# Patient Record
Sex: Male | Born: 1995 | Hispanic: No | Marital: Single | Smoking: Never smoker
Health system: Southern US, Community
[De-identification: ages and names within clinical notes are randomized; demographics above are authoritative.]

## PROBLEM LIST (undated history)

## (undated) DIAGNOSIS — J45909 Unspecified asthma, uncomplicated: Secondary | ICD-10-CM

## (undated) HISTORY — PX: TYMPANOSTOMY TUBE PLACEMENT: SHX32

## (undated) HISTORY — PX: WISDOM TOOTH EXTRACTION: SHX21

---
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
## 2021-06-13 ENCOUNTER — Ambulatory Visit: Payer: Self-pay

## 2021-06-13 ENCOUNTER — Ambulatory Visit
Admission: EM | Admit: 2021-06-13 | Discharge: 2021-06-13 | Disposition: A | Discharge status: Home / Self Care | Payer: Self-pay | Attending: Family Medicine | Admitting: Family Medicine

## 2021-06-13 ENCOUNTER — Ambulatory Visit (INDEPENDENT_AMBULATORY_CARE_PROVIDER_SITE_OTHER): Payer: Self-pay

## 2021-06-13 DIAGNOSIS — M25511 Pain in right shoulder: Secondary | ICD-10-CM

## 2021-06-13 DIAGNOSIS — S40021A Contusion of right upper arm, initial encounter: Secondary | ICD-10-CM

## 2021-06-13 DIAGNOSIS — S60221A Contusion of right hand, initial encounter: Secondary | ICD-10-CM

## 2021-06-13 NOTE — ED Provider Notes (Signed)
?[facility] ? ? ? ?CSN: [id number] ?Arrival date & time: 06/13/21  1543 ? ? ?  ? ?History   ?Chief Complaint ?Chief Complaint  ?Patient presents with  ? Arm Swelling  ?  Arm swelling and pain  ? ? ?HPI ?Javier Mcgee is a 26 y.o. male.  ? ?Patient presenting today with significant bruising, swelling, pain to the right upper arm after a semitruck door slammed onto it.  Also some bruising and swelling to the right hand but much less pain or concern for this area.  Denies loss of range of motion, numbness, tingling, shooting pains down the arm.  Has been using ice to the area with mild temporary relief. ? ? ?Past Medical History:  ?Diagnosis Date  ? Asthma   ? ? ?There are no problems to display for this patient. ? ? ?Past Surgical History:  ?Procedure Laterality Date  ? TYMPANOSTOMY TUBE PLACEMENT    ? ? ? ? ? ?Home Medications   ? ?Prior to Admission medications   ?Not on File  ? ? ?Family History ?Family History  ?Problem Relation Age of Onset  ? Healthy Mother   ? Healthy Father   ? ? ?Social History ?Social History  ? ?Tobacco Use  ? Smoking status: Never  ?  Passive exposure: Never  ? Smokeless tobacco: Never  ?Vaping Use  ? Vaping Use: Never used  ?Substance Use Topics  ? Alcohol use: Yes  ?  Comment: Occas  ? Drug use: No  ? ? ? ?Allergies   ?Patient has no known allergies. ? ? ?Review of Systems ?Review of Systems ?Per HPI ? ?Physical Exam ?Triage Vital Signs ?ED Triage Vitals  ?Enc Vitals Group  ?   BP 06/13/21 1551 (!) 154/112  ?   Pulse Rate 06/13/21 1551 (!) 129  ?   Resp 06/13/21 1551 18  ?   Temp 06/13/21 1551 99.2 ?F (37.3 ?C)  ?   Temp Source 06/13/21 1551 Oral  ?   SpO2 06/13/21 1551 96 %  ?   Weight --   ?   Height --   ?   Head Circumference --   ?   Peak Flow --   ?   Pain Score 06/13/21 1554 7  ?   Pain Loc --   ?   Pain Edu? --   ?   Excl. in GC? --   ? ?No data found. ? ?Updated Vital Signs ?BP (!) 154/112 (BP Location: Right Arm)   Pulse (!) 129   Temp 99.2 ?F (37.3 ?C) (Oral)    Resp 18   SpO2 96%  ? ?Visual Acuity ?Right Eye Distance:   ?Left Eye Distance:   ?Bilateral Distance:   ? ?Right Eye Near:   ?Left Eye Near:    ?Bilateral Near:    ? ?Physical Exam ?Vitals and nursing note reviewed.  ?Constitutional:   ?   Appearance: Normal appearance.  ?HENT:  ?   Head: Atraumatic.  ?Eyes:  ?   Extraocular Movements: Extraocular movements intact.  ?   Conjunctiva/sclera: Conjunctivae normal.  ?Cardiovascular:  ?   Rate and Rhythm: Normal rate and regular rhythm.  ?Pulmonary:  ?   Effort: Pulmonary effort is normal.  ?   Breath sounds: Normal breath sounds.  ?Musculoskeletal:     ?   General: Swelling, tenderness and signs of injury present. No deformity. Normal range of motion.  ?   Cervical back: Normal range of motion and neck  supple.  ?   Comments: Range of motion painful but intact.  Grip strength full and equal bilateral hands.  Significant edema and bruising to the mid upper arm on the right, tender to palpation diffusely  ?Skin: ?   General: Skin is warm and dry.  ?   Findings: Bruising present.  ?Neurological:  ?   General: No focal deficit present.  ?   Mental Status: He is oriented to person, place, and time.  ?   Comments: Right upper extremity neurovascularly intact  ?Psychiatric:     ?   Mood and Affect: Mood normal.     ?   Thought Content: Thought content normal.     ?   Judgment: Judgment normal.  ? ? ? ?UC Treatments / Results  ?Labs ?(all labs ordered are listed, but only abnormal results are displayed) ?Labs Reviewed - No data to display ? ?EKG ? ? ?Radiology ?DG Shoulder Right ? ?Result Date: 06/13/2021 ?CLINICAL DATA:  Right shoulder pain.  Injured today at work. EXAM: RIGHT SHOULDER - 2+ VIEW COMPARISON:  None. FINDINGS: The glenohumeral and AC joints are maintained. No acute fractures identified. No abnormal soft tissue calcifications. Moderate lateral downsloping of the acromion is noted. The right ribs are intact and the visualized right lung is clear. IMPRESSION: No  acute bony findings. Electronically Signed   By: Rudie Meyer M.D.   On: 06/13/2021 16:12   ? ?Procedures ?Procedures (including critical care time) ? ?Medications Ordered in UC ?Medications - No data to display ? ?Initial Impression / Assessment and Plan / UC Course  ?I have reviewed the triage vital signs and the nursing notes. ? ?Pertinent labs & imaging results that were available during my care of the patient were reviewed by me and considered in my medical decision making (see chart for details). ? ?  ? ?X-ray today negative for acute bony abnormality, appears to be soft tissue swelling from a contusion.  Discussed orthopedic follow-up if worsening or not resolving.  RICE protocol, over-the-counter pain relievers, work note given. ? ?Final Clinical Impressions(s) / UC Diagnoses  ? ?Final diagnoses:  ?Contusion of right upper extremity, initial encounter  ?Contusion of right hand, initial encounter  ? ?Discharge Instructions   ?None ?  ? ?ED Prescriptions   ?None ?  ? ?PDMP not reviewed this encounter. ?  ?Particia Nearing, PA-C ?06/13/21 1634 ? ?

## 2021-06-13 NOTE — ED Triage Notes (Signed)
Pt states his right shoulder area is hurting and swollen since Monday afternoon at work ? ?Pt states the trailer door closed on him at work ? ?Pt states he has tried Ibuprofen and using [employer] that works for the moment and then pain returns ?

## 2022-09-07 ENCOUNTER — Encounter (HOSPITAL_COMMUNITY): Payer: Self-pay | Admitting: *Deleted

## 2022-09-07 ENCOUNTER — Other Ambulatory Visit: Payer: Self-pay

## 2022-09-07 ENCOUNTER — Emergency Department (HOSPITAL_COMMUNITY): Payer: BC Managed Care – PPO

## 2022-09-07 ENCOUNTER — Emergency Department (HOSPITAL_COMMUNITY)
Admission: EM | Admit: 2022-09-07 | Discharge: 2022-09-07 | Disposition: A | Discharge status: Home / Self Care | Payer: BC Managed Care – PPO | Attending: Emergency Medicine | Admitting: Emergency Medicine

## 2022-09-07 DIAGNOSIS — J45909 Unspecified asthma, uncomplicated: Secondary | ICD-10-CM | POA: Insufficient documentation

## 2022-09-07 DIAGNOSIS — K5900 Constipation, unspecified: Secondary | ICD-10-CM | POA: Insufficient documentation

## 2022-09-07 NOTE — Discharge Instructions (Signed)
Your x-ray was reassuring.  You may have some continued constipation.  I recommend that you take over-the-counter MiraLAX, 1 heaping capful mixed in 8 to 10 ounces of water or juice 1-2 times a day as needed to help with your constipation.  Please follow-up with your primary care provider for recheck return to the emergency department for any new or worsening symptoms.

## 2022-09-07 NOTE — ED Triage Notes (Signed)
Pt c/o constipation.  Pt with several small stools earlier today but not normal. Pt started taking fiber earlier this week due to constipation.

## 2022-09-09 NOTE — ED Provider Notes (Signed)
[place] EMERGENCY DEPARTMENT AT [facility] Provider Note   CSN: [id number] Arrival date & time: 09/07/22  1247     History  Chief Complaint  Patient presents with   Constipation    Javier Mcgee is a 27 y.o. male.   Constipation Associated symptoms: no abdominal pain, no diarrhea, no fever, no nausea and no vomiting        Javier Mcgee is a 27 y.o. male with past medical history of asthma who presents to the Emergency Department complaining of constipation for several days.  States he has been taking fiber supplement and had several stools earlier, but states his stool "appears thin" he denies any bloody or black stools, some abdominal discomfort but denies significant pain.  No fever or chills, appetite changes flank pain or dysuria.  No history of prior abdominal surgeries.  He is concerned because several family members have had colon cancer.  Home Medications Prior to Admission medications   Not on File      Allergies    Patient has no known allergies.    Review of Systems   Review of Systems  Constitutional:  Negative for appetite change, chills and fever.  Respiratory:  Negative for shortness of breath.   Cardiovascular:  Negative for chest pain.  Gastrointestinal:  Positive for constipation. Negative for abdominal pain, diarrhea, nausea and vomiting.  Genitourinary:  Negative for difficulty urinating.  Neurological:  Negative for weakness and numbness.    Physical Exam Updated Vital Signs BP (!) 142/83 (BP Location: Right Arm)   Pulse 79   Temp 98.6 F (37 C) (Oral)   Resp 20   Ht 5\' 9"  (1.753 m)   Wt 86.2 kg   SpO2 100%   BMI 28.06 kg/m  Physical Exam Vitals and nursing note reviewed.  Constitutional:      General: He is not in acute distress.    Appearance: Normal appearance. He is not toxic-appearing.  Cardiovascular:     Rate and Rhythm: Normal rate and regular rhythm.     Pulses: Normal pulses.  Pulmonary:     Effort:  Pulmonary effort is normal.  Abdominal:     General: There is no distension.     Palpations: Abdomen is soft. There is no mass.     Tenderness: There is no abdominal tenderness.  Musculoskeletal:        General: Normal range of motion.  Skin:    General: Skin is warm.     Capillary Refill: Capillary refill takes less than 2 seconds.  Neurological:     General: No focal deficit present.     Mental Status: He is alert.     Sensory: No sensory deficit.     Motor: No weakness.     ED Results / Procedures / Treatments   Labs (all labs ordered are listed, but only abnormal results are displayed) Labs Reviewed - No data to display  EKG None  Radiology DG Abdomen 1 View  Result Date: 09/07/2022 CLINICAL DATA:  Constipation EXAM: ABDOMEN - 1 VIEW COMPARISON:  None Available. FINDINGS: The bowel gas pattern is normal. No abnormal fecal retention. No radio-opaque calculi or other significant radiographic abnormality are seen. IMPRESSION: Negative. Electronically Signed   By: Duanne Guess D.O.   On: 09/07/2022 14:39     Procedures Procedures    Medications Ordered in ED Medications - No data to display  ED Course/ Medical Decision Making/ A&P  Medical Decision Making Patient here with concern for constipation.  Has been constipated for several days taking over-the-counter fiber supplement and notes several stools earlier but notes stools are thin in appearance.  Denies any bloody black or tarry stools.  No significant abdominal pain fever, nausea or vomiting.  Well-appearing patient without significant abdominal pain, constipation felt to be secondary to dietary changes, SBO considered but felt less likely given lack of significant clinical finding or history of prior abdominal surgery.  He has concern for colon cancer as he has several family members with colon cancer.  Also felt less likely given age and lack of other symptoms besides  constipation.  Amount and/or Complexity of Data Reviewed Radiology: ordered.    Details: 1 view of the abdomen without acute findings Discussion of management or test interpretation with external provider(s):   Patient here with reported constipation.  Doubt emergent process.  Discussed dietary changes, increased water intake.  I recommended over-the-counter MiraLAX daily as needed for his constipation.  Will follow-up with PCP if needed.  Appears appropriate for discharge home.  All questions were answered.           Final Clinical Impression(s) / ED Diagnoses Final diagnoses:  Constipation, unspecified constipation type    Rx / DC Orders ED Discharge Orders     None         Pauline Aus, PA-C 09/09/22 1516    Benjiman Core, MD 09/09/22 [phone]

## 2022-12-24 ENCOUNTER — Encounter: Payer: Self-pay | Admitting: Nurse Practitioner

## 2022-12-24 ENCOUNTER — Ambulatory Visit (INDEPENDENT_AMBULATORY_CARE_PROVIDER_SITE_OTHER): Payer: BC Managed Care – PPO | Admitting: Nurse Practitioner

## 2022-12-24 VITALS — BP 120/60 | HR 84 | Temp 98.0°F | Ht 69.0 in | Wt 174.4 lb

## 2022-12-24 DIAGNOSIS — Z800 Family history of malignant neoplasm of digestive organs: Secondary | ICD-10-CM

## 2022-12-24 DIAGNOSIS — R198 Other specified symptoms and signs involving the digestive system and abdomen: Secondary | ICD-10-CM

## 2022-12-24 DIAGNOSIS — R634 Abnormal weight loss: Secondary | ICD-10-CM | POA: Diagnosis not present

## 2022-12-24 DIAGNOSIS — Z23 Encounter for immunization: Secondary | ICD-10-CM | POA: Diagnosis not present

## 2022-12-24 DIAGNOSIS — Z7689 Persons encountering health services in other specified circumstances: Secondary | ICD-10-CM

## 2022-12-24 DIAGNOSIS — Z2821 Immunization not carried out because of patient refusal: Secondary | ICD-10-CM

## 2022-12-24 NOTE — Progress Notes (Signed)
Madelaine Bhat, CMA,acting as a Neurosurgeon for Arnette Felts, FNP.,have documented all relevant documentation on the behalf of Arnette Felts, FNP,as directed by  Arnette Felts, FNP while in the presence of Arnette Felts, FNP.  Subjective:  Patient ID: Javier Mcgee , male    DOB: 03-13-1995 , 27 y.o.   MRN: [id number]  No chief complaint on file.   HPI  Patient presents today to establish care, he has not had a PCP since Pediatrics. He found [place] online. He works as an Ecologist. Single and no children.   PMH - Asthma (no medications) as a child.  Mother - colon or cervical - passed earlier this year. Diagnosed early 2023.  Maternal grandfather - possible colon cancer.  1 brother and 3 sisters - healthy.   Patient denies any chest pain, SOB, or headaches. Patient reports he has had changes in his bowel movements since about June. Patient reports it takes him longer to have one now. For a month it was like "pebbles" he went back to back was cleaned out. Continues to take 20-30 minutes to have a bowel a bowel movement.  Patient reports it is smooth but it is not as much as previously. He has been eating more fiber. He had gained weight while caring for his mother and feels his weight loss is naturally declining.      Past Medical History:  Diagnosis Date   Asthma      Family History  Problem Relation Age of Onset   Healthy Mother    Cancer Mother    Healthy Father    Cancer Maternal Grandfather     No current outpatient medications on file.   No Known Allergies   Review of Systems  Constitutional: Negative.   HENT: Negative.    Eyes: Negative.   Respiratory: Negative.    Cardiovascular: Negative.   Gastrointestinal:  Positive for constipation.  Endocrine: Negative.   Genitourinary: Negative.   Musculoskeletal: Negative.   Allergic/Immunologic: Negative.   Neurological: Negative.   Psychiatric/Behavioral: Negative.       Today's Vitals   12/24/22 1101  BP: 120/60   Pulse: 84  Temp: 98 F (36.7 C)  TempSrc: Oral  Weight: 174 lb 6.4 oz (79.1 kg)  Height: 5\' 9"  (1.753 m)  PainSc: 0-No pain   Body mass index is 25.75 kg/m.  Wt Readings from Last 3 Encounters:  12/24/22 174 lb 6.4 oz (79.1 kg)  09/07/22 190 lb (86.2 kg)  11/05/11 123 lb (55.8 kg) (63%, Z= 0.33)*   * Growth percentiles are based on CDC (Boys, 2-20 Years) data.     Objective:  Physical Exam Vitals reviewed.  Constitutional:      General: He is not in acute distress.    Appearance: Normal appearance. He is obese.  HENT:     Head: Normocephalic.  Cardiovascular:     Rate and Rhythm: Normal rate and regular rhythm.     Pulses: Normal pulses.     Heart sounds: Normal heart sounds. No murmur heard. Pulmonary:     Effort: Pulmonary effort is normal. No respiratory distress.     Breath sounds: Normal breath sounds. No wheezing.  Abdominal:     General: Abdomen is flat. Bowel sounds are normal. There is no distension.     Palpations: Abdomen is soft. There is no mass.     Tenderness: There is no abdominal tenderness.     Hernia: No hernia is present.  Musculoskeletal:  General: Normal range of motion.  Skin:    General: Skin is warm and dry.     Capillary Refill: Capillary refill takes less than 2 seconds.  Neurological:     General: No focal deficit present.     Mental Status: He is alert and oriented to person, place, and time.  Psychiatric:        Mood and Affect: Mood normal.        Behavior: Behavior normal.        Thought Content: Thought content normal.        Judgment: Judgment normal.         Assessment And Plan:  Establishing care with new doctor, encounter for Assessment & Plan: Patient is here to establish care. Went over patient medical, family, social and surgical history. Reviewed with patient their medications and any allergies  Reviewed with patient their sexual orientation, drug/tobacco and alcohol use Dicussed any new concerns with  patient  recommended patient comes in for a physical exam and complete blood work.  Educated patient about the importance of annual screenings and immunizations.  Advised patient to eat a healthy diet along with exercise for atleast 30-45 min atleast 4-5 days of the week.     Need for Tdap vaccination Assessment & Plan: Will give tetanus vaccine today while in office. Refer to order management. TDAP will be administered to adults 65-58 years old every 10 years.   Orders: -     Tdap vaccine greater than or equal to 7yo IM  Need for influenza vaccination Assessment & Plan: Influenza vaccine administered Encouraged to take Tylenol as needed for fever or muscle aches.   Orders: -     Flu vaccine trivalent PF, 6mos and older(Flulaval,Afluria,Fluarix,Fluzone)  COVID-19 vaccination declined Assessment & Plan: Declines covid 19 vaccine. Discussed risk of covid 61 and if he changes her mind about the vaccine to call the office. Education has been provided regarding the importance of this vaccine but patient still declined. Advised may receive this vaccine at local pharmacy or Health Dept.or vaccine clinic. Aware to provide a copy of the vaccination record if obtained from local pharmacy or Health Dept.  Encouraged to take multivitamin, vitamin d, vitamin c and zinc to increase immune system. Aware can call office if would like to have vaccine here at office. Verbalized acceptance and understanding.    Change in bowel function Assessment & Plan: Due to family history of colon cancer and change in bowel pattern will refer to GI for further evaluation  Orders: -     Ambulatory referral to Gastroenterology -     CBC with Differential/Platelet -     CMP14+EGFR  Family history of colon cancer in mother -     Ambulatory referral to Gastroenterology -     CBC with Differential/Platelet -     CMP14+EGFR  Weight loss Assessment & Plan: He feels is related to the recent loss of his mother,  he was caring for her and did not eat the healthiest. However due to the concern of bowel change and weight loss will refer to GI  Orders: -     TSH -     Iron, TIBC and Ferritin Panel    Return for 4-6 months HM.  Patient was given opportunity to ask questions. Patient verbalized understanding of the plan and was able to repeat key elements of the plan. All questions were answered to their satisfaction.    Jeanell Sparrow, FNP, have reviewed all  documentation for this visit. The documentation on 12/24/22 for the exam, diagnosis, procedures, and orders are all accurate and complete.   IF YOU HAVE BEEN REFERRED TO A SPECIALIST, IT MAY TAKE 1-2 WEEKS TO SCHEDULE/PROCESS THE REFERRAL. IF YOU HAVE NOT HEARD FROM US/SPECIALIST IN TWO WEEKS, PLEASE GIVE [place] A CALL AT [phone].

## 2022-12-25 LAB — IRON,TIBC AND FERRITIN PANEL
Ferritin: 270 ng/mL (ref 30–400)
Iron Saturation: 40 % (ref 15–55)
Iron: 134 ug/dL (ref 38–169)
Total Iron Binding Capacity: 335 ug/dL (ref 250–450)
UIBC: 201 ug/dL (ref 111–343)

## 2022-12-25 LAB — CMP14+EGFR
ALT: 21 [IU]/L (ref 0–44)
AST: 20 [IU]/L (ref 0–40)
Albumin: 5.2 g/dL (ref 4.3–5.2)
Alkaline Phosphatase: 84 [IU]/L (ref 44–121)
BUN/Creatinine Ratio: 19 (ref 9–20)
BUN: 15 mg/dL (ref 6–20)
Bilirubin Total: 0.5 mg/dL (ref 0.0–1.2)
CO2: 25 mmol/L (ref 20–29)
Calcium: 10.4 mg/dL — ABNORMAL HIGH (ref 8.7–10.2)
Chloride: 101 mmol/L (ref 96–106)
Creatinine, Ser: 0.81 mg/dL (ref 0.76–1.27)
Globulin, Total: 2.2 g/dL (ref 1.5–4.5)
Glucose: 81 mg/dL (ref 70–99)
Potassium: 4.5 mmol/L (ref 3.5–5.2)
Sodium: 141 mmol/L (ref 134–144)
Total Protein: 7.4 g/dL (ref 6.0–8.5)
eGFR: 125 mL/min/{1.73_m2} (ref >59)

## 2022-12-25 LAB — CBC WITH DIFFERENTIAL/PLATELET
Basophils Absolute: 0 10*3/uL (ref 0.0–0.2)
Basos: 0 %
EOS (ABSOLUTE): 0.1 10*3/uL (ref 0.0–0.4)
Eos: 2 %
Hematocrit: 46.9 % (ref 37.5–51.0)
Hemoglobin: 15.3 g/dL (ref 13.0–17.7)
Immature Grans (Abs): 0 10*3/uL (ref 0.0–0.1)
Immature Granulocytes: 0 %
Lymphocytes Absolute: 1.7 10*3/uL (ref 0.7–3.1)
Lymphs: 22 %
MCH: 31.4 pg (ref 26.6–33.0)
MCHC: 32.6 g/dL (ref 31.5–35.7)
MCV: 96 fL (ref 79–97)
Monocytes Absolute: 0.5 10*3/uL (ref 0.1–0.9)
Monocytes: 6 %
Neutrophils Absolute: 5.2 10*3/uL (ref 1.4–7.0)
Neutrophils: 70 %
Platelets: 344 10*3/uL (ref 150–450)
RBC: 4.88 x10E6/uL (ref 4.14–5.80)
RDW: 12.2 % (ref 11.6–15.4)
WBC: 7.5 10*3/uL (ref 3.4–10.8)

## 2022-12-25 LAB — TSH: TSH: 1.44 u[IU]/mL (ref 0.450–4.500)

## 2022-12-30 DIAGNOSIS — R198 Other specified symptoms and signs involving the digestive system and abdomen: Secondary | ICD-10-CM | POA: Insufficient documentation

## 2022-12-30 DIAGNOSIS — Z7689 Persons encountering health services in other specified circumstances: Secondary | ICD-10-CM | POA: Insufficient documentation

## 2022-12-30 DIAGNOSIS — Z0000 Encounter for general adult medical examination without abnormal findings: Secondary | ICD-10-CM | POA: Insufficient documentation

## 2022-12-30 DIAGNOSIS — Z23 Encounter for immunization: Secondary | ICD-10-CM | POA: Insufficient documentation

## 2022-12-30 DIAGNOSIS — Z2821 Immunization not carried out because of patient refusal: Secondary | ICD-10-CM | POA: Insufficient documentation

## 2022-12-30 DIAGNOSIS — R634 Abnormal weight loss: Secondary | ICD-10-CM | POA: Insufficient documentation

## 2022-12-30 NOTE — Assessment & Plan Note (Addendum)
He feels is related to the recent loss of his mother, he was caring for her and did not eat the healthiest. However due to the concern of bowel change and weight loss will refer to GI

## 2022-12-30 NOTE — Assessment & Plan Note (Signed)

## 2022-12-30 NOTE — Assessment & Plan Note (Signed)
Due to family history of colon cancer and change in bowel pattern will refer to GI for further evaluation

## 2022-12-30 NOTE — Assessment & Plan Note (Signed)
Will give tetanus vaccine today while in office. Refer to order management. TDAP will be administered to adults 79-25 years old every 10 years.

## 2022-12-30 NOTE — Assessment & Plan Note (Signed)
Influenza vaccine administered Encouraged to take Tylenol as needed for fever or muscle aches.

## 2022-12-30 NOTE — Assessment & Plan Note (Signed)

## 2023-01-02 ENCOUNTER — Ambulatory Visit: Payer: BC Managed Care – PPO | Admitting: Gastroenterology

## 2023-01-02 ENCOUNTER — Other Ambulatory Visit: Payer: Self-pay | Admitting: *Deleted

## 2023-01-02 ENCOUNTER — Encounter: Payer: Self-pay | Admitting: Gastroenterology

## 2023-01-02 ENCOUNTER — Encounter: Payer: Self-pay | Admitting: *Deleted

## 2023-01-02 VITALS — BP 121/84 | HR 109 | Temp 100.0°F | Ht 70.0 in | Wt 177.0 lb

## 2023-01-02 DIAGNOSIS — R634 Abnormal weight loss: Secondary | ICD-10-CM

## 2023-01-02 DIAGNOSIS — R140 Abdominal distension (gaseous): Secondary | ICD-10-CM | POA: Diagnosis not present

## 2023-01-02 DIAGNOSIS — R198 Other specified symptoms and signs involving the digestive system and abdomen: Secondary | ICD-10-CM

## 2023-01-02 DIAGNOSIS — Z800 Family history of malignant neoplasm of digestive organs: Secondary | ICD-10-CM | POA: Diagnosis not present

## 2023-01-02 DIAGNOSIS — K5900 Constipation, unspecified: Secondary | ICD-10-CM | POA: Diagnosis not present

## 2023-01-02 DIAGNOSIS — Z83719 Family history of colon polyps, unspecified: Secondary | ICD-10-CM

## 2023-01-02 MED ORDER — PEG 3350-KCL-NA BICARB-NACL 420 G PO SOLR: 4000.0000 mL | Freq: Once | ORAL | 0 refills | Status: AC

## 2023-01-02 NOTE — Patient Instructions (Addendum)
We are scheduling you for a colonoscopy in the near future with Dr. Marletta Lor.  Please reduce your fiber to once daily to see if this helps with your bloating and with feeling of incomplete emptying.  You may use Dulcolax over-the-counter as needed if you continue to have issues with incomplete emptying and need to wipe frequently despite reduction in fiber.  We will likely need to plan to use MiraLAX if you have no improvement with Dulcolax.  If you do not have any improvement with reducing your fiber to once daily then you can resume back to twice daily.  Please monitor your weight at least once weekly at home.  If you have more than a 5 pound weight loss in 4 weeks please let me know.  I would like to see you in the office in 3 months, sooner if needed.  It was a pleasure to see you today. I want to create trusting relationships with patients. If you receive a survey regarding your visit,  I greatly appreciate you taking time to fill this out on paper or through your MyChart. I value your feedback.  Brooke Bonito, MSN, FNP-BC, AGACNP-BC [facility]

## 2023-01-02 NOTE — Progress Notes (Signed)
GI Office Note    Referring Provider: Arnette Felts, FNP Primary Care Physician:  Arnette Felts, FNP  Primary Gastroenterologist: Hennie Duos. Marletta Lor, DO  Chief Complaint   Chief Complaint  Patient presents with   change in bowel habits     Bowel are different in size, color and shape.    History of Present Illness   Javier Mcgee is a 27 y.o. male presenting today at the request of Arnette Felts, FNP for change in bowel habits  Established care with PCP on 12/24/22. Reportedly recently caring for his mother who passed away.  She was diagnosed with colon cancer.  He reports he has been having some weight loss but feels this is natural given he gained weight while caring for her due to eating poorly.  He reported change in bowel habits, taking 20-30 minutes to have a bowel movement and at one point was having pebbles.  Stools reportedly soft but taking long periods of time to have a bowel movement.  He reported eating more fiber.  Labs including CBC, CMP, TSH, and iron panel ordered.  Labs 12/24/2022: CBC, TSH, and iron panel all completely within normal limits.  CMP also unremarkable other than mildly elevated calcium.  Today:  Since June he has been experience with decreased amount of stools. His normal was a BM that took less time but sometimes hard and hurt to come out. Right now taking him longer and having lesser amount and it is softer. Prior to June he was eating junk food. Currently only has fruit and yogurt, at lunch he has a sandwich. Dinner could be spaghetti, eggs, beans, steak, tortillas. Now eating less than her was previously.  He does report some weight loss since April. Has lost about 30 lbs. He states he went back to work in April (early this year and late last year he gained lots of weight). No changes in appetite. 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  He reports one episode of blood in his stool after  his ED visit ion July. Has not been tacking miralax. Currently spending about 20 minutes on the commode at a time. Does not really have pain in his belly. He does feel bloated at times and it gets worse throughout the day. Feeling incomplete emptying and wiping multiple times to get clean.   Taking fiber powder, a digestive enzyme and a probiotic. Has been taking since he went to the ED. Doing fiber twice daily.   No chest pain or shortness of breath.   Brother is 30 and recently had polyps. He was also having some issues.   Wt Readings from Last 3 Encounters:  01/02/23 177 lb (80.3 kg)  12/24/22 174 lb 6.4 oz (79.1 kg)  09/07/22 190 lb (86.2 kg)   Current Outpatient Medications  Medication Sig Dispense Refill   Cholecalciferol (VITAMIN D) 50 MCG (2000 UT) CAPS Take by mouth.     Multiple Vitamin (MULTIVITAMIN WITH MINERALS) TABS tablet Take 1 tablet by mouth daily.     Omega 3-6-9 Fatty Acids (OMEGA 3-6-9 COMPLEX PO) Take by mouth.     No current facility-administered medications for this visit.    Past Medical History:  Diagnosis Date   Asthma     Past Surgical History:  Procedure Laterality Date   TYMPANOSTOMY TUBE PLACEMENT      Family History  Problem Relation Age of Onset   Colon cancer Mother  73   Healthy Father    Cancer Maternal Grandfather        possibly colon cancer?    Allergies as of 01/02/2023   (No Known Allergies)    Social History   Socioeconomic History   Marital status: Single    Spouse name: Not on file   Number of children: Not on file   Years of education: Not on file   Highest education level: Not on file  Occupational History   Occupation: [employer]  Tobacco Use   Smoking status: Never    Passive exposure: Never   Smokeless tobacco: Never  Vaping Use   Vaping status: Never Used  Substance and Sexual Activity   Alcohol use: Not Currently    Comment: Occas   Drug use: Never   Sexual activity: Not Currently    Birth  control/protection: None  Other Topics Concern   Not on file  Social History Narrative   Not on file   Social Determinants of Health   Financial Resource Strain: Not on file  Food Insecurity: Not on file  Transportation Needs: Not on file  Physical Activity: Not on file  Stress: Not on file  Social Connections: Not on file  Intimate Partner Violence: Not on file     Review of Systems   Gen: Denies any fever, chills, fatigue, weight loss, lack of appetite.  CV: Denies chest pain, heart palpitations, peripheral edema, syncope.  Resp: Denies shortness of breath at rest or with exertion. Denies wheezing or cough.  GI: see HPI GU : Denies urinary burning, urinary frequency, urinary hesitancy MS: Denies joint pain, muscle weakness, cramps, or limitation of movement.  Derm: Denies rash, itching, dry skin Psych: Denies depression, anxiety, memory loss, and confusion Heme: Denies bruising, bleeding, and enlarged lymph nodes.   Physical Exam   BP 121/84 (BP Location: Right Arm, Patient Position: Sitting, Cuff Size: Normal)   Pulse (!) 109   Temp 100 F (37.8 C) (Temporal)   Ht 5\' 10"  (1.778 m)   Wt 177 lb (80.3 kg)   BMI 25.40 kg/m   General:   Alert and oriented. Pleasant and cooperative. Well-nourished and well-developed.  Head:  Normocephalic and atraumatic. Eyes:  Without icterus, sclera clear and conjunctiva pink.  Ears:  Normal auditory acuity. Mouth:  No deformity or lesions, oral mucosa pink.  Lungs:  Clear to auscultation bilaterally. No wheezes, rales, or rhonchi. No distress.  Heart:  S1, S2 present without murmurs appreciated.  Abdomen:  +BS, soft, non-tender and non-distended. No HSM noted. No guarding or rebound. No masses appreciated.  Rectal:  Deferred  Msk:  Symmetrical without gross deformities. Normal posture. Extremities:  Without edema. Neurologic:  Alert and  oriented x4;  grossly normal neurologically. Skin:  Intact without significant lesions or  rashes. Psych:  Alert and cooperative. Normal mood and affect.   Assessment   Javier Mcgee is a 27 y.o. male with a history of asthma presenting today for evaluation of constipation and change in bowel habits.  Family history of colon cancer in mother, weight loss: Mother diagnosed with colon cancer age 36, recently passed earlier this year.  Brother recently had colonoscopy, he is 108 years old and had some colon polyps removed.  Per review of his weights he has lost about 15 pounds since July of this year, he reports about a 30 pound weight loss since April.  He has admitted to some weight gain late last year and earlier this year due to  poor eating habits while caring for his mother.  He currently is eating 3 meals a day and eating much healthier but still has not been trying to lose weight.  Weight loss could be secondary to grief as well as healthier eating/decrease caloric intake however given his family history he would benefit from colonoscopy.  Change in bowel habits, constipation, bloating: Has had recent change in bowel habits over the last several months, had ED visit in July for constipation.  He reports occasional straining and spending long periods of time on commode as well as significant incomplete emptying.  Currently taking fiber supplement, probiotic, and digestive enzymes daily.  His digestive enzymes include multiple different herbs and natural ingredients as well as amylase and lipase.  Query whether or not digestive enzymes could be contributing to his constipation.  In therapy with extra fiber intake he should have improvement of bowel function.  His stools are currently softer than they used to be however he reports a decreased amount and issues with straining.  Given his change in bowel habits despite healthier eating habits as well as his family history of colon cancer and colon polyps along with weight loss he would benefit from further evaluation with colonoscopy.  For now  advised for him to decrease his fiber intake in case he is getting too much and advised him that digestive enzymes could potentially be contributing to constipation.  Advised to decrease fiber to once daily and take occasional Dulcolax and if no improvement then we will consider MiraLAX.  PLAN   Continue fiber and probiotic, reduce to once daily. Dulcolax as needed for incomplete emptying Proceed with colonoscopy with propofol by Dr. Marletta Lor  in near future: the risks, benefits, and alternatives have been discussed with the patient in detail. The patient states understanding and desires to proceed. ASA 2 Will transition to miralax if no improvement with otc stimulant laxative.  Monitor weight weekly.  Follow up in 3 months.    Brooke Bonito, MSN, FNP-BC, AGACNP-BC [facility]

## 2023-02-04 ENCOUNTER — Encounter (HOSPITAL_COMMUNITY)
Admission: RE | Disposition: A | Discharge status: Home / Self Care | Payer: Self-pay | Source: Home / Self Care | Attending: Internal Medicine

## 2023-02-04 ENCOUNTER — Other Ambulatory Visit: Payer: Self-pay

## 2023-02-04 ENCOUNTER — Ambulatory Visit (HOSPITAL_COMMUNITY)
Admission: RE | Admit: 2023-02-04 | Discharge: 2023-02-04 | Disposition: A | Discharge status: Home / Self Care | Payer: BC Managed Care – PPO | Attending: Internal Medicine | Admitting: Internal Medicine

## 2023-02-04 ENCOUNTER — Encounter (HOSPITAL_COMMUNITY): Payer: Self-pay

## 2023-02-04 ENCOUNTER — Ambulatory Visit (HOSPITAL_COMMUNITY): Payer: BC Managed Care – PPO | Admitting: Anesthesiology

## 2023-02-04 DIAGNOSIS — Z6825 Body mass index (BMI) 25.0-25.9, adult: Secondary | ICD-10-CM | POA: Diagnosis not present

## 2023-02-04 DIAGNOSIS — R634 Abnormal weight loss: Secondary | ICD-10-CM | POA: Diagnosis not present

## 2023-02-04 DIAGNOSIS — R194 Change in bowel habit: Secondary | ICD-10-CM | POA: Diagnosis present

## 2023-02-04 DIAGNOSIS — Z800 Family history of malignant neoplasm of digestive organs: Secondary | ICD-10-CM | POA: Diagnosis not present

## 2023-02-04 HISTORY — PX: COLONOSCOPY WITH PROPOFOL: SHX5780

## 2023-02-04 SURGERY — COLONOSCOPY WITH PROPOFOL
Anesthesia: General

## 2023-02-04 MED ORDER — PROPOFOL 10 MG/ML IV BOLUS
INTRAVENOUS | Status: DC | PRN
  Administered 2023-02-04: 50 mg via INTRAVENOUS
  Administered 2023-02-04: 100 mg via INTRAVENOUS
  Administered 2023-02-04: 50 mg via INTRAVENOUS

## 2023-02-04 MED ORDER — PROPOFOL 500 MG/50ML IV EMUL
INTRAVENOUS | Status: DC | PRN
Start: 2023-02-04 — End: 2023-02-04
  Administered 2023-02-04: 150 ug/kg/min via INTRAVENOUS

## 2023-02-04 MED ORDER — PROPOFOL 500 MG/50ML IV EMUL
INTRAVENOUS | Status: AC
  Filled 2023-02-04: qty 50

## 2023-02-04 MED ORDER — PROPOFOL 10 MG/ML IV BOLUS: INTRAVENOUS | Status: DC | PRN

## 2023-02-04 MED ORDER — LIDOCAINE HCL (CARDIAC) PF 100 MG/5ML IV SOSY
PREFILLED_SYRINGE | INTRAVENOUS | Status: DC | PRN
  Administered 2023-02-04: 60 mg via INTRAVENOUS

## 2023-02-04 MED ORDER — LACTATED RINGERS IV SOLN: INTRAVENOUS | Status: DC | PRN

## 2023-02-04 NOTE — Op Note (Signed)
[facility] Patient Name: Javier Mcgee Procedure Date: 02/04/2023 1:10 PM MRN: [id number] Date of Birth: Apr 04, 1995 Attending MD: Hennie Duos. Maple Mirza, [id number] CSN: [id number] Age: 27 Admit Type: Outpatient Procedure:                Colonoscopy Indications:              Change in bowel habits, Weight loss, Family history                            of colon cancer in mother Providers:                Hennie Duos. Marletta Lor, DO, Buel Ream. Thomasena Edis RN, RN,                            Zena Amos Referring MD:              Medicines:                See the Anesthesia note for documentation of the                            administered medications Complications:            No immediate complications. Estimated Blood Loss:     Estimated blood loss: none. Procedure:                Pre-Anesthesia Assessment:                           - The anesthesia plan was to use monitored                            anesthesia care (MAC).                           After obtaining informed consent, the colonoscope                            was passed under direct vision. Throughout the                            procedure, the patient's blood pressure, pulse, and                            oxygen saturations were monitored continuously. The                            [id number]-HQ190L ([id number]) scope was introduced through                            the anus and advanced to the the terminal ileum,                            with identification of the appendiceal orifice and                            IC valve. The  colonoscopy was performed without                            difficulty. The patient tolerated the procedure                            well. The quality of the bowel preparation was                            evaluated using the BBPS [facility] Bowel Preparation                            Scale) with scores of: Right Colon = 3, Transverse                            Colon = 3 and Left Colon = 3  (entire mucosa seen                            well with no residual staining, small fragments of                            stool or opaque liquid). The total BBPS score                            equals 9. Scope In: 1:16:36 PM Scope Out: 1:27:47 PM Scope Withdrawal Time: 0 hours 9 minutes 16 seconds  Total Procedure Duration: 0 hours 11 minutes 11 seconds  Findings:      The colon (entire examined portion) appeared normal.      The terminal ileum appeared normal. Impression:               - The entire examined colon is normal.                           - The examined portion of the ileum was normal.                           - No specimens collected. Moderate Sedation:      Per Anesthesia Care Recommendation:           - Patient has a contact number available for                            emergencies. The signs and symptoms of potential                            delayed complications were discussed with the                            patient. Return to normal activities tomorrow.                            Written discharge instructions were provided to the  patient.                           - Resume previous diet.                           - Continue present medications.                           - Repeat colonoscopy at age 88 due to family                            history of colon cancer in mother (age 13).                           - Return to GI clinic in 3 months. Procedure Code(s):        --- Professional ---                           [phone], Colonoscopy, flexible; diagnostic, including                            collection of specimen(s) by brushing or washing,                            when performed (separate procedure) Diagnosis Code(s):        --- Professional ---                           R19.4, Change in bowel habit                           R63.4, Abnormal weight loss CPT copyright 2022 American Medical Association. All rights  reserved. The codes documented in this report are preliminary and upon coder review may  be revised to meet current compliance requirements. Hennie Duos. Marletta Lor, DO Hennie Duos. Marletta Lor, DO 02/04/2023 1:33:04 PM This report has been signed electronically. Number of Addenda: 0

## 2023-02-04 NOTE — Discharge Instructions (Addendum)
  Colonoscopy Discharge Instructions  Read the instructions outlined below and refer to this sheet in the next few weeks. These discharge instructions provide you with general information on caring for yourself after you leave the hospital. Your doctor may also give you specific instructions. While your treatment has been planned according to the most current medical practices available, unavoidable complications occasionally occur.   ACTIVITY You may resume your regular activity, but move at a slower pace for the next 24 hours.  Take frequent rest periods for the next 24 hours.  Walking will help get rid of the air and reduce the bloated feeling in your belly (abdomen).  No driving for 24 hours (because of the medicine (anesthesia) used during the test).   Do not sign any important legal documents or operate any machinery for 24 hours (because of the anesthesia used during the test).  NUTRITION Drink plenty of fluids.  You may resume your normal diet as instructed by your doctor.  Begin with a light meal and progress to your normal diet. Heavy or fried foods are harder to digest and may make you feel sick to your stomach (nauseated).  Avoid alcoholic beverages for 24 hours or as instructed.  MEDICATIONS You may resume your normal medications unless your doctor tells you otherwise.  WHAT YOU CAN EXPECT TODAY Some feelings of bloating in the abdomen.  Passage of more gas than usual.  Spotting of blood in your stool or on the toilet paper.  IF YOU HAD POLYPS REMOVED DURING THE COLONOSCOPY: No aspirin products for 7 days or as instructed.  No alcohol for 7 days or as instructed.  Eat a soft diet for the next 24 hours.  FINDING OUT THE RESULTS OF YOUR TEST Not all test results are available during your visit. If your test results are not back during the visit, make an appointment with your caregiver to find out the results. Do not assume everything is normal if you have not heard from your  caregiver or the medical facility. It is important for you to follow up on all of your test results.  SEEK IMMEDIATE MEDICAL ATTENTION IF: You have more than a spotting of blood in your stool.  Your belly is swollen (abdominal distention).  You are nauseated or vomiting.  You have a temperature over 101.  You have abdominal pain or discomfort that is severe or gets worse throughout the day.   Your colonoscopy was relatively unremarkable.  I did not find any polyps or evidence of colon cancer.  I recommend repeating colonoscopy at age 27 for colon cancer screening purposes.    Overall, your colon appeared very healthy.  I did not see any active inflammation indicative of underlying inflammatory bowel disease such as Crohn's disease or ulcerative colitis throughout your colon or end portion of your small bowel.    Follow up in GI office in 3 months.  Office will notify you.  I hope you have a great rest of your week!  Hennie Duos. Marletta Lor, D.O. Gastroenterology and Hepatology [facility]

## 2023-02-04 NOTE — H&P (Signed)
Primary Care Physician:  Arnette Felts, FNP Primary Gastroenterologist:  Dr. Marletta Lor  Pre-Procedure History & Physical: HPI:  Javier Mcgee is a 27 y.o. male is here for a colonoscopy to be performed for family history of colon cancer in mother, weight loss, change in bowel habits.  Past Medical History:  Diagnosis Date   Asthma     Past Surgical History:  Procedure Laterality Date   TYMPANOSTOMY TUBE PLACEMENT     WISDOM TOOTH EXTRACTION      Prior to Admission medications   Medication Sig Start Date End Date Taking? Authorizing Provider  Cholecalciferol (VITAMIN D) 50 MCG (2000 UT) CAPS Take by mouth.   Yes [provider]  Multiple Vitamin (MULTIVITAMIN WITH MINERALS) TABS tablet Take 1 tablet by mouth daily.   Yes [provider]  Omega 3-6-9 Fatty Acids (OMEGA 3-6-9 COMPLEX PO) Take by mouth.   Yes [provider]    Allergies as of 01/02/2023   (No Known Allergies)    Family History  Problem Relation Age of Onset   Colon cancer Mother 27   Healthy Father    Cancer Maternal Grandfather        possibly colon cancer?    Social History   Socioeconomic History   Marital status: Single    Spouse name: Not on file   Number of children: Not on file   Years of education: Not on file   Highest education level: Not on file  Occupational History   Occupation: [employer]  Tobacco Use   Smoking status: Never    Passive exposure: Never   Smokeless tobacco: Never  Vaping Use   Vaping status: Never Used  Substance and Sexual Activity   Alcohol use: Not Currently    Comment: Occas   Drug use: Never   Sexual activity: Not Currently    Birth control/protection: None  Other Topics Concern   Not on file  Social History Narrative   Not on file   Social Determinants of Health   Financial Resource Strain: Not on file  Food Insecurity: Not on file  Transportation Needs: Not on file  Physical Activity: Not on file  Stress: Not on file  Social  Connections: Not on file  Intimate Partner Violence: Not on file    Review of Systems: See HPI, otherwise negative ROS  Physical Exam: Vital signs in last 24 hours: Temp:  [99.9 F (37.7 C)] 99.9 F (37.7 C) (12/03 1245) Pulse Rate:  [106] 106 (12/03 1245) Resp:  [16] 16 (12/03 1245) BP: (137)/(83) 137/83 (12/03 1245) SpO2:  [99 %] 99 % (12/03 1245) Weight:  [79.4 kg] 79.4 kg (12/03 1232)   General:   Alert,  Well-developed, well-nourished, pleasant and cooperative in NAD Head:  Normocephalic and atraumatic. Eyes:  Sclera clear, no icterus.   Conjunctiva pink. Ears:  Normal auditory acuity. Nose:  No deformity, discharge,  or lesions. Msk:  Symmetrical without gross deformities. Normal posture. Extremities:  Without clubbing or edema. Neurologic:  Alert and  oriented x4;  grossly normal neurologically. Skin:  Intact without significant lesions or rashes. Psych:  Alert and cooperative. Normal mood and affect.  Impression/Plan: Javier Mcgee is here for a colonoscopy to be performed for family history of colon cancer in mother, weight loss, change in bowel habits.  The risks of the procedure including infection, bleed, or perforation as well as benefits, limitations, alternatives and imponderables have been reviewed with the patient. Questions have been answered. All parties agreeable.

## 2023-02-04 NOTE — Anesthesia Postprocedure Evaluation (Signed)
Anesthesia Post Note  Patient: Javier Mcgee  Procedure(s) Performed: COLONOSCOPY WITH PROPOFOL  Patient location during evaluation: PACU Anesthesia Type: General Level of consciousness: awake and alert Pain management: pain level controlled Vital Signs Assessment: post-procedure vital signs reviewed and stable Respiratory status: spontaneous breathing, nonlabored ventilation, respiratory function stable and patient connected to nasal cannula oxygen Cardiovascular status: blood pressure returned to baseline and stable Postop Assessment: no apparent nausea or vomiting Anesthetic complications: no   There were no known notable events for this encounter.   Last Vitals:  Vitals:   02/04/23 1333 02/04/23 1340  BP: (!) 85/44 115/75  Pulse: (!) 112 (!) 112  Resp: 17 20  Temp: 36.8 C   SpO2: 92% 94%    Last Pain:  Vitals:   02/04/23 1333  TempSrc:   PainSc: 0-No pain                 [name] L Othel Dicostanzo

## 2023-02-04 NOTE — Anesthesia Preprocedure Evaluation (Signed)
Anesthesia Evaluation  Patient identified by MRN, date of birth, ID band Patient awake    Reviewed: Allergy & Precautions, H&P , NPO status , Patient's Chart, lab work & pertinent test results, reviewed documented beta blocker date and time   Airway Mallampati: II  TM Distance: >3 FB Neck ROM: full    Dental no notable dental hx. (+) Dental Advisory Given, Teeth Intact   Pulmonary asthma    Pulmonary exam normal breath sounds clear to auscultation       Cardiovascular Exercise Tolerance: Good negative cardio ROS Normal cardiovascular exam Rhythm:regular Rate:Normal     Neuro/Psych negative neurological ROS  negative psych ROS   GI/Hepatic negative GI ROS, Neg liver ROS,,,  Endo/Other  negative endocrine ROS    Renal/GU negative Renal ROS  negative genitourinary   Musculoskeletal   Abdominal   Peds  Hematology negative hematology ROS (+)   Anesthesia Other Findings   Reproductive/Obstetrics negative OB ROS                             Anesthesia Physical Anesthesia Plan  ASA: 2  Anesthesia Plan: General   Post-op Pain Management: Minimal or no pain anticipated   Induction: Intravenous  PONV Risk Score and Plan: Propofol infusion  Airway Management Planned: Nasal Cannula and Natural Airway  Additional Equipment: None  Intra-op Plan:   Post-operative Plan:   Informed Consent: I have reviewed the patients History and Physical, chart, labs and discussed the procedure including the risks, benefits and alternatives for the proposed anesthesia with the patient or authorized representative who has indicated his/her understanding and acceptance.     Dental Advisory Given  Plan Discussed with: CRNA  Anesthesia Plan Comments:         Anesthesia Quick Evaluation

## 2023-02-04 NOTE — Transfer of Care (Addendum)
Immediate Anesthesia Transfer of Care Note  Patient: Javier Mcgee  Procedure(s) Performed: COLONOSCOPY WITH PROPOFOL  Patient Location: Endoscopy Unit  Anesthesia Type:General  Level of Consciousness: drowsy and patient cooperative  Airway & Oxygen Therapy: Patient Spontanous Breathing and Patient connected to nasal cannula oxygen  Post-op Assessment: Report given to RN and Post -op Vital signs reviewed and stable  Post vital signs: Reviewed and stable  Last Vitals:  Vitals Value Taken Time  BP 85/44 02/04/23   1333  Temp 36.8 02/04/23   1333  Pulse 112 02/04/23   1333  Resp 17 02/04/23   1333  SpO2 92% 02/04/23   1333    Last Pain:  Vitals:   02/04/23 1309  TempSrc:   PainSc: 0-No pain      Patients Stated Pain Goal: 9 (02/04/23 1232)  Complications: No notable events documented.

## 2023-02-10 ENCOUNTER — Emergency Department (HOSPITAL_COMMUNITY): Payer: BC Managed Care – PPO

## 2023-02-10 ENCOUNTER — Emergency Department (HOSPITAL_COMMUNITY)
Admission: EM | Admit: 2023-02-10 | Discharge: 2023-02-10 | Disposition: A | Discharge status: Home / Self Care | Payer: BC Managed Care – PPO | Attending: Emergency Medicine | Admitting: Emergency Medicine

## 2023-02-10 ENCOUNTER — Other Ambulatory Visit: Payer: Self-pay

## 2023-02-10 ENCOUNTER — Encounter (HOSPITAL_COMMUNITY): Payer: Self-pay

## 2023-02-10 DIAGNOSIS — J45901 Unspecified asthma with (acute) exacerbation: Secondary | ICD-10-CM | POA: Insufficient documentation

## 2023-02-10 DIAGNOSIS — Z7952 Long term (current) use of systemic steroids: Secondary | ICD-10-CM | POA: Insufficient documentation

## 2023-02-10 DIAGNOSIS — Z20822 Contact with and (suspected) exposure to covid-19: Secondary | ICD-10-CM | POA: Insufficient documentation

## 2023-02-10 DIAGNOSIS — R0602 Shortness of breath: Secondary | ICD-10-CM | POA: Diagnosis present

## 2023-02-10 LAB — CBC WITH DIFFERENTIAL/PLATELET
Abs Immature Granulocytes: 0.03 K/uL (ref 0.00–0.07)
Basophils Absolute: 0.1 K/uL (ref 0.0–0.1)
Basophils Relative: 1 %
Eosinophils Absolute: 0.5 K/uL (ref 0.0–0.5)
Eosinophils Relative: 4 %
HCT: 47.2 % (ref 39.0–52.0)
Hemoglobin: 16.7 g/dL (ref 13.0–17.0)
Immature Granulocytes: 0 %
Lymphocytes Relative: 16 %
Lymphs Abs: 1.9 K/uL (ref 0.7–4.0)
MCH: 32.3 pg (ref 26.0–34.0)
MCHC: 35.4 g/dL (ref 30.0–36.0)
MCV: 91.3 fL (ref 80.0–100.0)
Monocytes Absolute: 0.6 K/uL (ref 0.1–1.0)
Monocytes Relative: 5 %
Neutro Abs: 8.7 K/uL — ABNORMAL HIGH (ref 1.7–7.7)
Neutrophils Relative %: 74 %
Platelets: 418 K/uL — ABNORMAL HIGH (ref 150–400)
RBC: 5.17 MIL/uL (ref 4.22–5.81)
RDW: 13.2 % (ref 11.5–15.5)
WBC: 11.8 K/uL — ABNORMAL HIGH (ref 4.0–10.5)
nRBC: 0 % (ref 0.0–0.2)

## 2023-02-10 LAB — RESP PANEL BY RT-PCR (RSV, FLU A&B, COVID)  RVPGX2
Influenza A by PCR: NEGATIVE
Influenza B by PCR: NEGATIVE
Resp Syncytial Virus by PCR: NEGATIVE
SARS Coronavirus 2 by RT PCR: NEGATIVE

## 2023-02-10 LAB — BASIC METABOLIC PANEL
Anion gap: 12 (ref 5–15)
BUN: 11 mg/dL (ref 6–20)
CO2: 25 mmol/L (ref 22–32)
Calcium: 10.4 mg/dL — ABNORMAL HIGH (ref 8.9–10.3)
Chloride: 100 mmol/L (ref 98–111)
Creatinine, Ser: 0.88 mg/dL (ref 0.61–1.24)
GFR, Estimated: >60 mL/min (ref >60)
Glucose, Bld: 121 mg/dL — ABNORMAL HIGH (ref 70–99)
Potassium: 3.5 mmol/L (ref 3.5–5.1)
Sodium: 137 mmol/L (ref 135–145)

## 2023-02-10 LAB — D-DIMER, QUANTITATIVE: D-Dimer, Quant: 0.35 ug{FEU}/mL (ref 0.00–0.50)

## 2023-02-10 MED ORDER — PREDNISONE 50 MG PO TABS: 50.0000 mg | ORAL_TABLET | Freq: Every day | ORAL | 0 refills | Status: AC

## 2023-02-10 MED ORDER — IPRATROPIUM-ALBUTEROL 0.5-2.5 (3) MG/3ML IN SOLN
3.0000 mL | Freq: Once | RESPIRATORY_TRACT | Status: AC
  Administered 2023-02-10: 3 mL via RESPIRATORY_TRACT
  Filled 2023-02-10: qty 3

## 2023-02-10 MED ORDER — METHYLPREDNISOLONE SODIUM SUCC 125 MG IJ SOLR
125.0000 mg | Freq: Once | INTRAMUSCULAR | Status: AC
  Administered 2023-02-10: 125 mg via INTRAVENOUS
  Filled 2023-02-10: qty 2

## 2023-02-10 MED ORDER — AEROCHAMBER PLUS FLO-VU SMALL MISC
1.0000 | Freq: Once | Status: AC
  Administered 2023-02-10: 1
  Filled 2023-02-10 (×2): qty 1

## 2023-02-10 MED ORDER — ALBUTEROL SULFATE HFA 108 (90 BASE) MCG/ACT IN AERS
2.0000 | INHALATION_SPRAY | Freq: Once | RESPIRATORY_TRACT | Status: AC
  Administered 2023-02-10: 2 via RESPIRATORY_TRACT
  Filled 2023-02-10: qty 6.7

## 2023-02-10 NOTE — ED Triage Notes (Signed)
Pt c/o SOB since last Wednesday that has increased since yesterday. Pt states that he was walking up and down stairs to do laundry and became wheezy and severely SOB. Pt states hx of childhood asthma but has not been treated for it since he was 27 y.o.

## 2023-02-10 NOTE — Discharge Instructions (Addendum)
You were seen in the Emergency Department for shortness of breath and wheezing Your symptoms improved after 1 albuterol DuoNeb treatment here and a dose of steroids We gave you an albuterol inhaler and spacer to go home with and use as directed for wheezing and coughing We have also called in a prescription for prednisone for 3 more days.  Prednisone is a steroid and can help with asthma exacerbations Please follow-up with your primary care doctor in 1 week for reevaluation Return to the emergency department for trouble breathing, chest pain or any other concerns

## 2023-02-10 NOTE — ED Provider Notes (Signed)
[place] [facility] DE[facility]MENT AT [facility] Provider Note   CSN: [id number] Arrival date & time: 02/10/23  0102     History  Chief Complaint  Patient presents with   Shortness of Breath    Javier BARRACLOUGH is a 27 y.o. male.  With a history of asthma who presents to ED for shortness of breath.  Patient first experienced shortness of breath beginning 5 days ago that has persisted since the onset.  He notes a dry cough and dyspnea on exertion.  No fevers, chills, chest pain or nasal congestion.  He does have a history of asthma but has not had any asthma related issues for many years.  No albuterol at home.  No prior history of venous thromboembolism.  HPI     Home Medications Prior to Admission medications   Medication Sig Start Date End Date Taking? Authorizing Provider  predniSONE (DELTASONE) 50 MG tablet Take 1 tablet (50 mg total) by mouth daily with breakfast for 3 days. 02/10/23 02/13/23 Yes Royanne Foots, DO  Cholecalciferol (VITAMIN D) 50 MCG (2000 UT) CAPS Take by mouth.    [provider]  Multiple Vitamin (MULTIVITAMIN WITH MINERALS) TABS tablet Take 1 tablet by mouth daily.    [provider]  Omega 3-6-9 Fatty Acids (OMEGA 3-6-9 COMPLEX PO) Take by mouth.    [provider]      Allergies    Patient has no known allergies.    Review of Systems   Review of Systems  Physical Exam Updated Vital Signs BP (!) 134/91   Pulse (!) 103   Temp 98.2 F (36.8 C) (Oral)   Resp 15   Ht 5\' 10"  (1.778 m)   Wt 79.4 kg   SpO2 91%   BMI 25.11 kg/m  Physical Exam Vitals and nursing note reviewed.  HENT:     Head: Normocephalic and atraumatic.  Eyes:     Pupils: Pupils are equal, round, and reactive to light.  Cardiovascular:     Rate and Rhythm: Normal rate and regular rhythm.  Pulmonary:     Effort: Pulmonary effort is normal.     Comments: Diffuse expiratory wheezing Abdominal:     Palpations: Abdomen is soft.      Tenderness: There is no abdominal tenderness.  Skin:    General: Skin is warm and dry.  Neurological:     Mental Status: He is alert.  Psychiatric:        Mood and Affect: Mood normal.     ED Results / Procedures / Treatments   Labs (all labs ordered are listed, but only abnormal results are displayed) Labs Reviewed  BASIC METABOLIC PANEL - Abnormal; Notable for the following components:      Result Value   Glucose, Bld 121 (*)    Calcium 10.4 (*)    All other components within normal limits  CBC WITH DIFFERENTIAL/PLATELET - Abnormal; Notable for the following components:   WBC 11.8 (*)    Platelets 418 (*)    Neutro Abs 8.7 (*)    All other components within normal limits  RESP PANEL BY RT-PCR (RSV, FLU A&B, COVID)  RVPGX2  D-DIMER, QUANTITATIVE    EKG EKG Interpretation Date/Time:  Monday February 10 2023 07:30:43 EST Ventricular Rate:  118 PR Interval:  166 QRS Duration:  83 QT Interval:  291 QTC Calculation: 408 R Axis:   91  Text Interpretation: Sinus tachycardia Consider right atrial enlargement Borderline right axis deviation Borderline T  wave abnormalities Confirmed by Estelle June [phone]) on 02/10/2023 9:46:38 AM  Radiology DG Chest Portable 1 View  Result Date: 02/10/2023 CLINICAL DATA:  Shortness of breath, asthma EXAM: PORTABLE CHEST - 1 VIEW COMPARISON:  11/05/2011 FINDINGS: Lungs are clear. Heart size and mediastinal contours are within normal limits. No effusion. Visualized bones unremarkable. IMPRESSION: No acute cardiopulmonary disease. Electronically Signed   By: Corlis Leak M.D.   On: 02/10/2023 07:51    Procedures Procedures    Medications Ordered in ED Medications  albuterol (VENTOLIN HFA) 108 (90 Base) MCG/ACT inhaler 2 puff (has no administration in time range)  AeroChamber Plus Flo-Vu Small device MISC 1 each (has no administration in time range)  ipratropium-albuterol (DUONEB) 0.5-2.5 (3) MG/3ML nebulizer solution 3 mL (3 mLs Nebulization  Given 02/10/23 0754)  methylPREDNISolone sodium succinate (SOLU-MEDROL) 125 mg/2 mL injection 125 mg (125 mg Intravenous Given 02/10/23 0753)    ED Course/ Medical Decision Making/ A&P Clinical Course as of 02/10/23 0946  Mon Feb 10, 2023  2952 Reviewed laboratory workup.  D-dimer of 0.35 not consistent with VTE.  No need for CTA of the chest.  Slight leukocytosis of 11.8 with neutrophilic predominance.  COVID/influenza/RSV all negative.  Chest x-ray shows no acute pulmonary disease.  EKG shows no dysrhythmia or evidence of acute ischemic changes [MP]  0942 Reevaluated patient.  He reports significant improvement in his breathing.  Wheezing has resolved on lung auscultation.  Will discharge with albuterol MDI and spacer and a couple more days of short course of steroids to help with asthma exacerbation in setting of likely viral URI.  Stable for discharge at this time [MP]    Clinical Course User Index [MP] Royanne Foots, DO                                 Medical Decision Making 27 year old male with history of asthma presenting for 5 days of shortness of breath.  Afebrile with oxygen saturation in the mid 90s on room air.  Tachycardic and normotensive.  Exam notable for diffuse expiratory wheezing.  No other reported symptoms aside from a dry cough.  Differential diagnosis includes Asthma exacerbation.  Viral URI most likely trigger.  Will obtain chest x-ray and provide DuoNeb treatment along with IV Solu-Medrol and reassess Viral URI. Pneumonia.  Will obtain CBC and look for focal consolidation on chest x-ray Pulmonary embolism.  No significant risk factors but he has not had issues with asthma in many years and is tachycardic.  Will obtain D-dimer to evaluate and CTA of the chest if D-dimer significantly elevated ACS.  Low suspicion at this time given he has not experienced any chest pain and is otherwise young and healthy. Dysrhythmia will obtain EKG here  Amount and/or Complexity of  Data Reviewed Labs: ordered. Radiology: ordered.  Risk Prescription drug management.           Final Clinical Impression(s) / ED Diagnoses Final diagnoses:  Mild asthma with exacerbation, unspecified whether persistent    Rx / DC Orders ED Discharge Orders          Ordered    predniSONE (DELTASONE) 50 MG tablet  Daily with breakfast        02/10/23 0945              Royanne Foots, DO 02/10/23 [phone]

## 2023-02-10 NOTE — ED Notes (Signed)
Performed peak flow test with patient. 200 was his best. He had good technique and knowledge of instructions.

## 2023-02-12 ENCOUNTER — Encounter (HOSPITAL_COMMUNITY): Payer: Self-pay | Admitting: Internal Medicine

## 2023-02-17 ENCOUNTER — Other Ambulatory Visit: Payer: Self-pay

## 2023-03-03 ENCOUNTER — Ambulatory Visit: Payer: BC Managed Care – PPO | Admitting: Nurse Practitioner

## 2023-03-03 ENCOUNTER — Encounter: Payer: Self-pay | Admitting: Nurse Practitioner

## 2023-03-03 VITALS — BP 100/60 | HR 86 | Temp 98.7°F | Ht 70.0 in | Wt 173.6 lb

## 2023-03-03 DIAGNOSIS — J45901 Unspecified asthma with (acute) exacerbation: Secondary | ICD-10-CM

## 2023-03-03 DIAGNOSIS — Z2821 Immunization not carried out because of patient refusal: Secondary | ICD-10-CM | POA: Diagnosis not present

## 2023-03-03 DIAGNOSIS — R0602 Shortness of breath: Secondary | ICD-10-CM

## 2023-03-03 MED ORDER — AIRSUPRA 90-80 MCG/ACT IN AERO: 2.0000 | INHALATION_SPRAY | Freq: Four times a day (QID) | RESPIRATORY_TRACT | 1 refills | Status: DC | PRN

## 2023-03-03 NOTE — Assessment & Plan Note (Signed)

## 2023-03-03 NOTE — Assessment & Plan Note (Signed)
No distress noted during visit.

## 2023-03-03 NOTE — Patient Instructions (Signed)
Avoid dairy products while having increased congestion Stay well hydrated with water  You can take zyrtec at bedtime If you have to use the Airsupra more than 3 times a day after a week call to office.

## 2023-03-03 NOTE — Assessment & Plan Note (Addendum)
Diminished breath sounds to bilateral bases, will send in Airsupra. Discussed if has to continue taking 3 times a day after one week. Reviewed ER note from 02/10/2023

## 2023-03-03 NOTE — Progress Notes (Signed)
Madelaine Bhat, CMA,acting as a Neurosurgeon for Arnette Felts, FNP.,have documented all relevant documentation on the behalf of Arnette Felts, FNP,as directed by  Arnette Felts, FNP while in the presence of Arnette Felts, FNP.  Subjective:  Patient ID: Javier Mcgee , male    DOB: Jul 24, 1995 , 27 y.o.   MRN: [id number]  Chief Complaint  Patient presents with   Asthma    HPI  Patient presents today due to having an asthma exacerbation.  Patient reports compliance with medication. Patient reports having to use his inhaler everyday about 3-4 times a day. He reports having shortness of breath. Patient reports a lot of mucus build up in his throat for about 2 weeks. He was seen in ER on 02/10/2023 and treated with steroid, albuterol inhaler and CXR which was normal. Patient also reports a rash on and off on the back of his neck and his forearm.   Reports has not had an asthma attack since age 14. After having his colonoscopy he began having asthma symptoms. He was given steroids, he also used a nebulizer.      Past Medical History:  Diagnosis Date   Asthma      Family History  Problem Relation Age of Onset   Colon cancer Mother 56   Healthy Father    Cancer Maternal Grandfather        possibly colon cancer?     Current Outpatient Medications:    Albuterol-Budesonide (AIRSUPRA) 90-80 MCG/ACT AERO, Inhale 2 puffs into the lungs every 6 (six) hours as needed., Disp: 32.1 g, Rfl: 1   Cholecalciferol (VITAMIN D) 50 MCG (2000 UT) CAPS, Take by mouth., Disp: , Rfl:    Multiple Vitamin (MULTIVITAMIN WITH MINERALS) TABS tablet, Take 1 tablet by mouth daily., Disp: , Rfl:    Omega 3-6-9 Fatty Acids (OMEGA 3-6-9 COMPLEX PO), Take by mouth., Disp: , Rfl:    No Known Allergies   Review of Systems  Constitutional: Negative.   Respiratory:  Positive for cough and shortness of breath.   Cardiovascular: Negative.   Neurological: Negative.   Psychiatric/Behavioral: Negative.       Today's Vitals    03/03/23 1513  BP: 100/60  Pulse: 86  Temp: 98.7 F (37.1 C)  TempSrc: Oral  SpO2: 96%  Weight: 173 lb 9.6 oz (78.7 kg)  Height: 5\' 10"  (1.778 m)  PainSc: 0-No pain   Body mass index is 24.91 kg/m.  Wt Readings from Last 3 Encounters:  03/03/23 173 lb 9.6 oz (78.7 kg)  02/10/23 175 lb (79.4 kg)  02/04/23 175 lb (79.4 kg)     Objective:  Physical Exam Vitals reviewed.  Constitutional:      General: He is not in acute distress.    Appearance: Normal appearance.  Pulmonary:     Effort: Pulmonary effort is normal. No respiratory distress.     Breath sounds: No decreased air movement. Examination of the right-upper field reveals decreased breath sounds. Examination of the left-upper field reveals decreased breath sounds. Decreased breath sounds present. No wheezing.  Skin:    General: Skin is warm.     Capillary Refill: Capillary refill takes less than 2 seconds.  Neurological:     Mental Status: He is alert.         Assessment And Plan:  Mild asthma with exacerbation, unspecified whether persistent Assessment & Plan: Diminished breath sounds to bilateral bases, will send in Airsupra. Discussed if has to continue taking 3 times a day after one  week. Reviewed ER note from 02/10/2023  Orders: -     Airsupra; Inhale 2 puffs into the lungs every 6 (six) hours as needed.  Dispense: 32.1 g; Refill: 1  Shortness of breath Assessment & Plan: No distress noted during visit.    COVID-19 vaccination declined Assessment & Plan: Declines covid 19 vaccine. Discussed risk of covid 18 and if he changes her mind about the vaccine to call the office. Education has been provided regarding the importance of this vaccine but patient still declined. Advised may receive this vaccine at local pharmacy or Health Dept.or vaccine clinic. Aware to provide a copy of the vaccination record if obtained from local pharmacy or Health Dept.  Encouraged to take multivitamin, vitamin d, vitamin c and zinc  to increase immune system. Aware can call office if would like to have vaccine here at office. Verbalized acceptance and understanding.      Return in about 4 months (around 07/02/2023) for phy when able..  Patient was given opportunity to ask questions. Patient verbalized understanding of the plan and was able to repeat key elements of the plan. All questions were answered to their satisfaction.    Jeanell Sparrow, FNP, have reviewed all documentation for this visit. The documentation on 03/03/23 for the exam, diagnosis, procedures, and orders are all accurate and complete.   IF YOU HAVE BEEN REFERRED TO A SPECIALIST, IT MAY TAKE 1-2 WEEKS TO SCHEDULE/PROCESS THE REFERRAL. IF YOU HAVE NOT HEARD FROM US/SPECIALIST IN TWO WEEKS, PLEASE GIVE [place] A CALL AT [phone].

## 2023-04-15 ENCOUNTER — Encounter: Payer: Self-pay | Admitting: Gastroenterology

## 2023-07-01 ENCOUNTER — Ambulatory Visit: Payer: BC Managed Care – PPO | Admitting: Nurse Practitioner

## 2023-07-01 VITALS — BP 120/60 | HR 91 | Temp 99.4°F | Ht 70.0 in | Wt 183.2 lb

## 2023-07-01 DIAGNOSIS — E663 Overweight: Secondary | ICD-10-CM

## 2023-07-01 DIAGNOSIS — Z6826 Body mass index (BMI) 26.0-26.9, adult: Secondary | ICD-10-CM

## 2023-07-01 DIAGNOSIS — R739 Hyperglycemia, unspecified: Secondary | ICD-10-CM | POA: Diagnosis not present

## 2023-07-01 DIAGNOSIS — J45901 Unspecified asthma with (acute) exacerbation: Secondary | ICD-10-CM

## 2023-07-01 DIAGNOSIS — Z0000 Encounter for general adult medical examination without abnormal findings: Secondary | ICD-10-CM

## 2023-07-01 DIAGNOSIS — Z2821 Immunization not carried out because of patient refusal: Secondary | ICD-10-CM

## 2023-07-01 DIAGNOSIS — Q161 Congenital absence, atresia and stricture of auditory canal (external): Secondary | ICD-10-CM | POA: Diagnosis not present

## 2023-07-01 DIAGNOSIS — Z13220 Encounter for screening for lipoid disorders: Secondary | ICD-10-CM

## 2023-07-01 MED ORDER — AIRSUPRA 90-80 MCG/ACT IN AERO: 2.0000 | INHALATION_SPRAY | Freq: Four times a day (QID) | RESPIRATORY_TRACT | 1 refills | Status: AC | PRN

## 2023-07-01 NOTE — Progress Notes (Signed)
 [place], CMA,acting as a Neurosurgeon for Susanna Epley, FNP.,have documented all relevant documentation on the behalf of Susanna Epley, FNP,as directed by  Susanna Epley, FNP while in the presence of Susanna Epley, FNP.  Subjective:   Patient ID: Javier Mcgee , male    DOB: 1995-09-08 , 28 y.o.   MRN: [id number]  Chief Complaint  Patient presents with   Annual Exam    Patient presents today for HM, Patient reports compliance with medication. Patient denies any chest pain, SOB, or headaches. Patient has no concerns today.     HPI  Patient endorse better asthma control since starting  Can go up to 5 days without using it, but typically uses it every 3 days.  Negative for chest pain, tachycardia, palpitations.  Negative for SOB, cough, wheezing.      Experiencing decreased constipation, has changed diet to less process foods, adding yogurt, carrots, berries and more vegetables to his daily meals.  Drinks water 64oz of water per day while working.   No concerns for STDs/STIs. Negative for joint pain, swelling.      Past Medical History:  Diagnosis Date   Asthma      Family History  Problem Relation Age of Onset   Colon cancer Mother 38   Healthy Father    Cancer Maternal Grandfather        possibly colon cancer?     Current Outpatient Medications:    Cholecalciferol (VITAMIN D) 50 MCG (2000 UT) CAPS, Take by mouth., Disp: , Rfl:    Multiple Vitamin (MULTIVITAMIN WITH MINERALS) TABS tablet, Take 1 tablet by mouth daily., Disp: , Rfl:    Omega 3-6-9 Fatty Acids (OMEGA 3-6-9 COMPLEX PO), Take by mouth., Disp: , Rfl:    Albuterol -Budesonide (AIRSUPRA ) 90-80 MCG/ACT AERO, Inhale 2 puffs into the lungs every 6 (six) hours as needed., Disp: 32.1 g, Rfl: 1   No Known Allergies   Men's preventive visit. Patient Health Questionnaire (PHQ-2) is  Flowsheet Row Office Visit from 12/24/2022 in [facility]  PHQ-2 Total Score 2     . Patient is on a  Regular diet. Marital status: Single. Relevant history for alcohol use is:  Social History   Substance and Sexual Activity  Alcohol Use Not Currently   Comment: Occas  Relevant history for tobacco use is:  Social History   Tobacco Use  Smoking Status Never   Passive exposure: Never  Smokeless Tobacco Never  .   Review of Systems  Constitutional:  Negative for chills, fatigue and fever.  Respiratory:  Negative for cough, shortness of breath and wheezing.   Gastrointestinal:  Negative for constipation, diarrhea and nausea.  Endocrine: Negative for polydipsia, polyphagia and polyuria.  Genitourinary:  Negative for dysuria, frequency, penile pain, penile swelling, scrotal swelling and testicular pain.  Musculoskeletal:  Negative for arthralgias and joint swelling.  Skin:  Negative for rash and wound.     Today's Vitals   07/01/23 1433  BP: 120/60  Pulse: 91  Temp: 99.4 F (37.4 C)  TempSrc: Oral  Weight: 183 lb 3.2 oz (83.1 kg)  Height: 5\' 10"  (1.778 m)  PainSc: 0-No pain   Body mass index is 26.29 kg/m.  Wt Readings from Last 3 Encounters:  07/01/23 183 lb 3.2 oz (83.1 kg)  03/03/23 173 lb 9.6 oz (78.7 kg)  02/10/23 175 lb (79.4 kg)    Objective:  Physical Exam Constitutional:      Appearance: Normal appearance.  HENT:  Head: Normocephalic and atraumatic.     Right Ear: Hearing, tympanic membrane, ear canal and external ear normal.     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     Nose: Nose normal.     Mouth/Throat:     Mouth: Mucous membranes are moist.     Pharynx: Oropharynx is clear.  Eyes:     Extraocular Movements: Extraocular movements intact.     Conjunctiva/sclera: Conjunctivae normal.     Pupils: Pupils are equal, round, and reactive to light.  Cardiovascular:     Rate and Rhythm: Normal rate and regular rhythm.     Pulses: Normal pulses.     Heart sounds: Normal heart sounds.  Pulmonary:     Effort: Pulmonary effort is  normal. No respiratory distress.     Breath sounds: Normal breath sounds. No wheezing.  Abdominal:     General: Abdomen is flat. Bowel sounds are normal. There is no distension.     Palpations: Abdomen is soft.     Tenderness: There is no abdominal tenderness. There is no guarding.  Genitourinary:    Penis: Normal.      Testes: Normal.  Musculoskeletal:        General: No swelling or deformity. Normal range of motion.     Cervical back: Normal range of motion.  Skin:    General: Skin is warm and dry.  Neurological:     General: No focal deficit present.     Mental Status: He is alert and oriented to person, place, and time. Mental status is at baseline.     Cranial Nerves: No cranial nerve deficit.     Motor: No weakness.  Psychiatric:        Mood and Affect: Mood normal.        Behavior: Behavior normal.        Thought Content: Thought content normal.        Judgment: Judgment normal.         Assessment And Plan:    Encounter for annual health examination Assessment & Plan: CBC, lipids, HgbA1C ordered Discussed inhaler use, when to call office Discussed testicular cancer screening procedure and frequency Discussed continuation of healthy diet   Orders: -     CBC with Differential/Platelet  Mild asthma with exacerbation, unspecified whether persistent Assessment & Plan: Better control with Airsupra , continue inhaler use as prescribed  Orders: -     CMP14+EGFR -     Airsupra ; Inhale 2 puffs into the lungs every 6 (six) hours as needed.  Dispense: 32.1 g; Refill: 1  COVID-19 vaccination declined Assessment & Plan: Declines covid 19 vaccine. Discussed risk of covid 19 and if he changes her mind about the vaccine to call the office. Education has been provided regarding the importance of this vaccine but patient still declined. Advised may receive this vaccine at local pharmacy or Health Dept.or vaccine clinic. Aware to provide a copy of the vaccination record if  obtained from local pharmacy or Health Dept.  Encouraged to take multivitamin, vitamin d, vitamin c and zinc to increase immune system. Aware can call office if would like to have vaccine here at office. Verbalized acceptance and understanding.    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 Patient was given opportunity to ask questions. Patient verbalized understanding of the plan and was able to repeat key elements of the plan. All questions were answered to their satisfaction.   I have reviewed this encounter including the documentation in this note and/or discussed this patient with Mickael Alamo FNP Student. I am certifying that I agree with the content of this note as the primary care nurse practitioner.  Susanna Epley, DNP, FNP-BC  I, Susanna Epley, FNP, have reviewed all documentation for this visit. The documentation on 07/01/23 for the exam, diagnosis, procedures, and orders are all accurate and complete.

## 2023-07-01 NOTE — Assessment & Plan Note (Signed)
 CBC, lipids, HgbA1C ordered Discussed inhaler use, when to call office Discussed testicular cancer screening procedure and frequency Discussed continuation of healthy diet

## 2023-07-01 NOTE — Assessment & Plan Note (Signed)
 Better control with Airsupra , continue inhaler use as prescribed

## 2023-07-02 LAB — CBC WITH DIFFERENTIAL/PLATELET
Basophils Absolute: 0 10*3/uL (ref 0.0–0.2)
Basos: 1 %
EOS (ABSOLUTE): 0.2 10*3/uL (ref 0.0–0.4)
Eos: 3 %
Hematocrit: 46.8 % (ref 37.5–51.0)
Hemoglobin: 15.8 g/dL (ref 13.0–17.7)
Immature Grans (Abs): 0 10*3/uL (ref 0.0–0.1)
Immature Granulocytes: 0 %
Lymphocytes Absolute: 2.1 10*3/uL (ref 0.7–3.1)
Lymphs: 26 %
MCH: 31.4 pg (ref 26.6–33.0)
MCHC: 33.8 g/dL (ref 31.5–35.7)
MCV: 93 fL (ref 79–97)
Monocytes Absolute: 0.6 10*3/uL (ref 0.1–0.9)
Monocytes: 7 %
Neutrophils Absolute: 5.1 10*3/uL (ref 1.4–7.0)
Neutrophils: 63 %
Platelets: 370 10*3/uL (ref 150–450)
RBC: 5.03 x10E6/uL (ref 4.14–5.80)
RDW: 12.6 % (ref 11.6–15.4)
WBC: 8 10*3/uL (ref 3.4–10.8)

## 2023-07-02 LAB — LIPID PANEL
Chol/HDL Ratio: 3.9 ratio (ref 0.0–5.0)
Cholesterol, Total: 171 mg/dL (ref 100–199)
HDL: 44 mg/dL (ref >39)
LDL Chol Calc (NIH): 106 mg/dL — ABNORMAL HIGH (ref 0–99)
Triglycerides: 115 mg/dL (ref 0–149)
VLDL Cholesterol Cal: 21 mg/dL (ref 5–40)

## 2023-07-02 LAB — CMP14+EGFR
ALT: 22 IU/L (ref 0–44)
AST: 22 IU/L (ref 0–40)
Albumin: 5 g/dL (ref 4.3–5.2)
Alkaline Phosphatase: 90 IU/L (ref 44–121)
BUN/Creatinine Ratio: 17 (ref 9–20)
BUN: 13 mg/dL (ref 6–20)
Bilirubin Total: 0.6 mg/dL (ref 0.0–1.2)
CO2: 22 mmol/L (ref 20–29)
Calcium: 10.6 mg/dL — ABNORMAL HIGH (ref 8.7–10.2)
Chloride: 100 mmol/L (ref 96–106)
Creatinine, Ser: 0.77 mg/dL (ref 0.76–1.27)
Globulin, Total: 2.3 g/dL (ref 1.5–4.5)
Glucose: 77 mg/dL (ref 70–99)
Potassium: 4.3 mmol/L (ref 3.5–5.2)
Sodium: 141 mmol/L (ref 134–144)
Total Protein: 7.3 g/dL (ref 6.0–8.5)
eGFR: 127 mL/min/{1.73_m2} (ref >59)

## 2023-07-02 LAB — HEMOGLOBIN A1C
Est. average glucose Bld gHb Est-mCnc: 103 mg/dL
Hgb A1c MFr Bld: 5.2 % (ref 4.8–5.6)

## 2023-07-13 ENCOUNTER — Encounter: Payer: Self-pay | Admitting: Nurse Practitioner

## 2023-07-13 DIAGNOSIS — R739 Hyperglycemia, unspecified: Secondary | ICD-10-CM | POA: Insufficient documentation

## 2023-07-13 DIAGNOSIS — Q161 Congenital absence, atresia and stricture of auditory canal (external): Secondary | ICD-10-CM | POA: Insufficient documentation

## 2023-07-13 NOTE — Assessment & Plan Note (Signed)
 This is noted to his left ear

## 2023-07-13 NOTE — Assessment & Plan Note (Signed)

## 2023-07-13 NOTE — Assessment & Plan Note (Signed)
 Will check blood sugar and A1c today

## 2023-07-15 IMAGING — DX DG SHOULDER 2+V*R*
3 series · 3 of 3 positions shown · non-contrast
Comparison: None.

CLINICAL DATA: Right shoulder pain.  Injured today at work.

EXAM:
RIGHT SHOULDER - 2+ VIEW

[shoulder internal rotation ap]
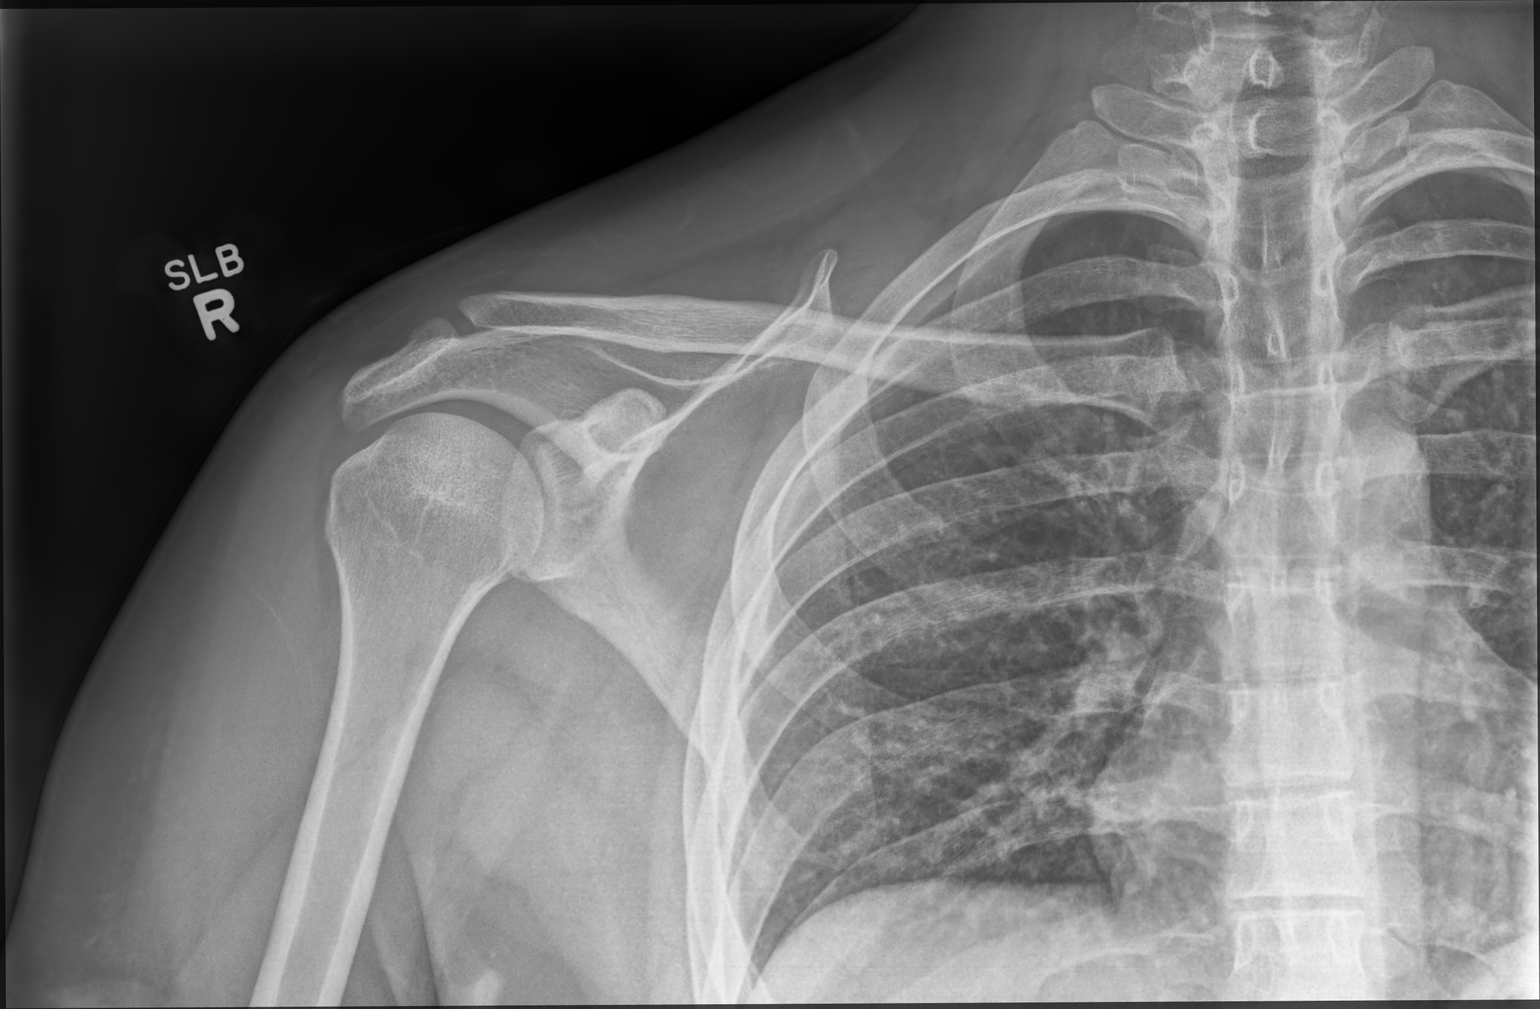

[shoulder external rotation ap]
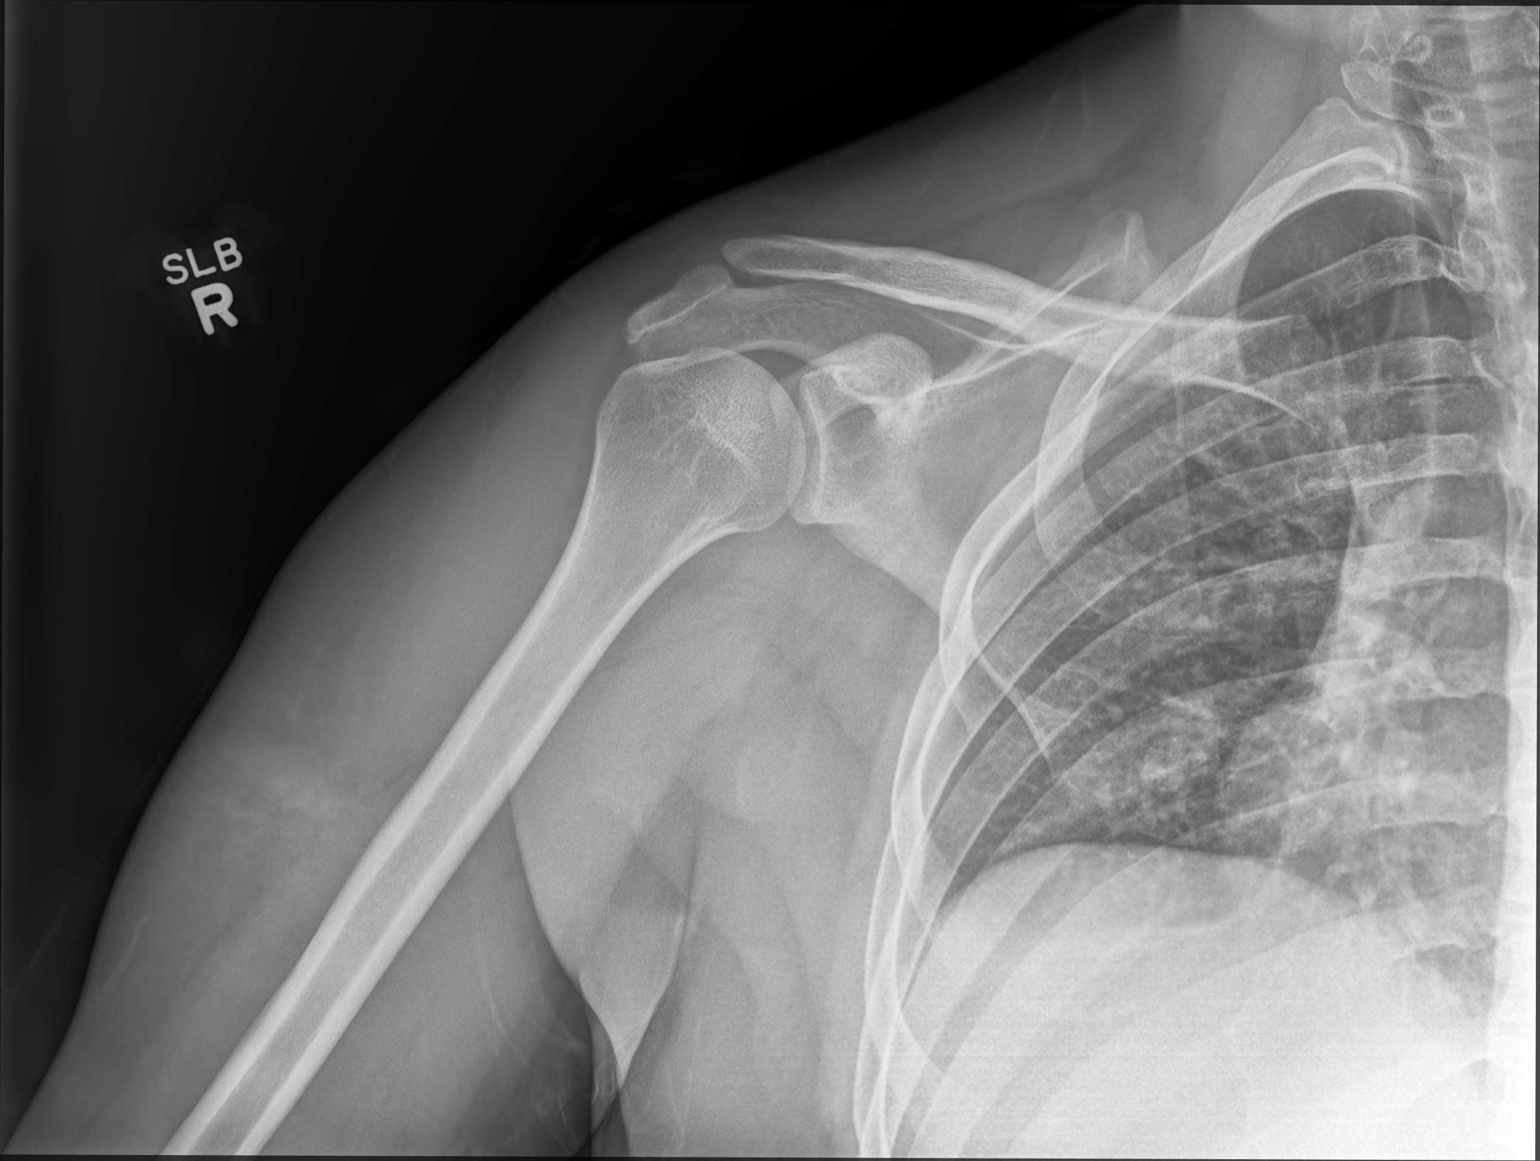

[shoulder (y view) lat]
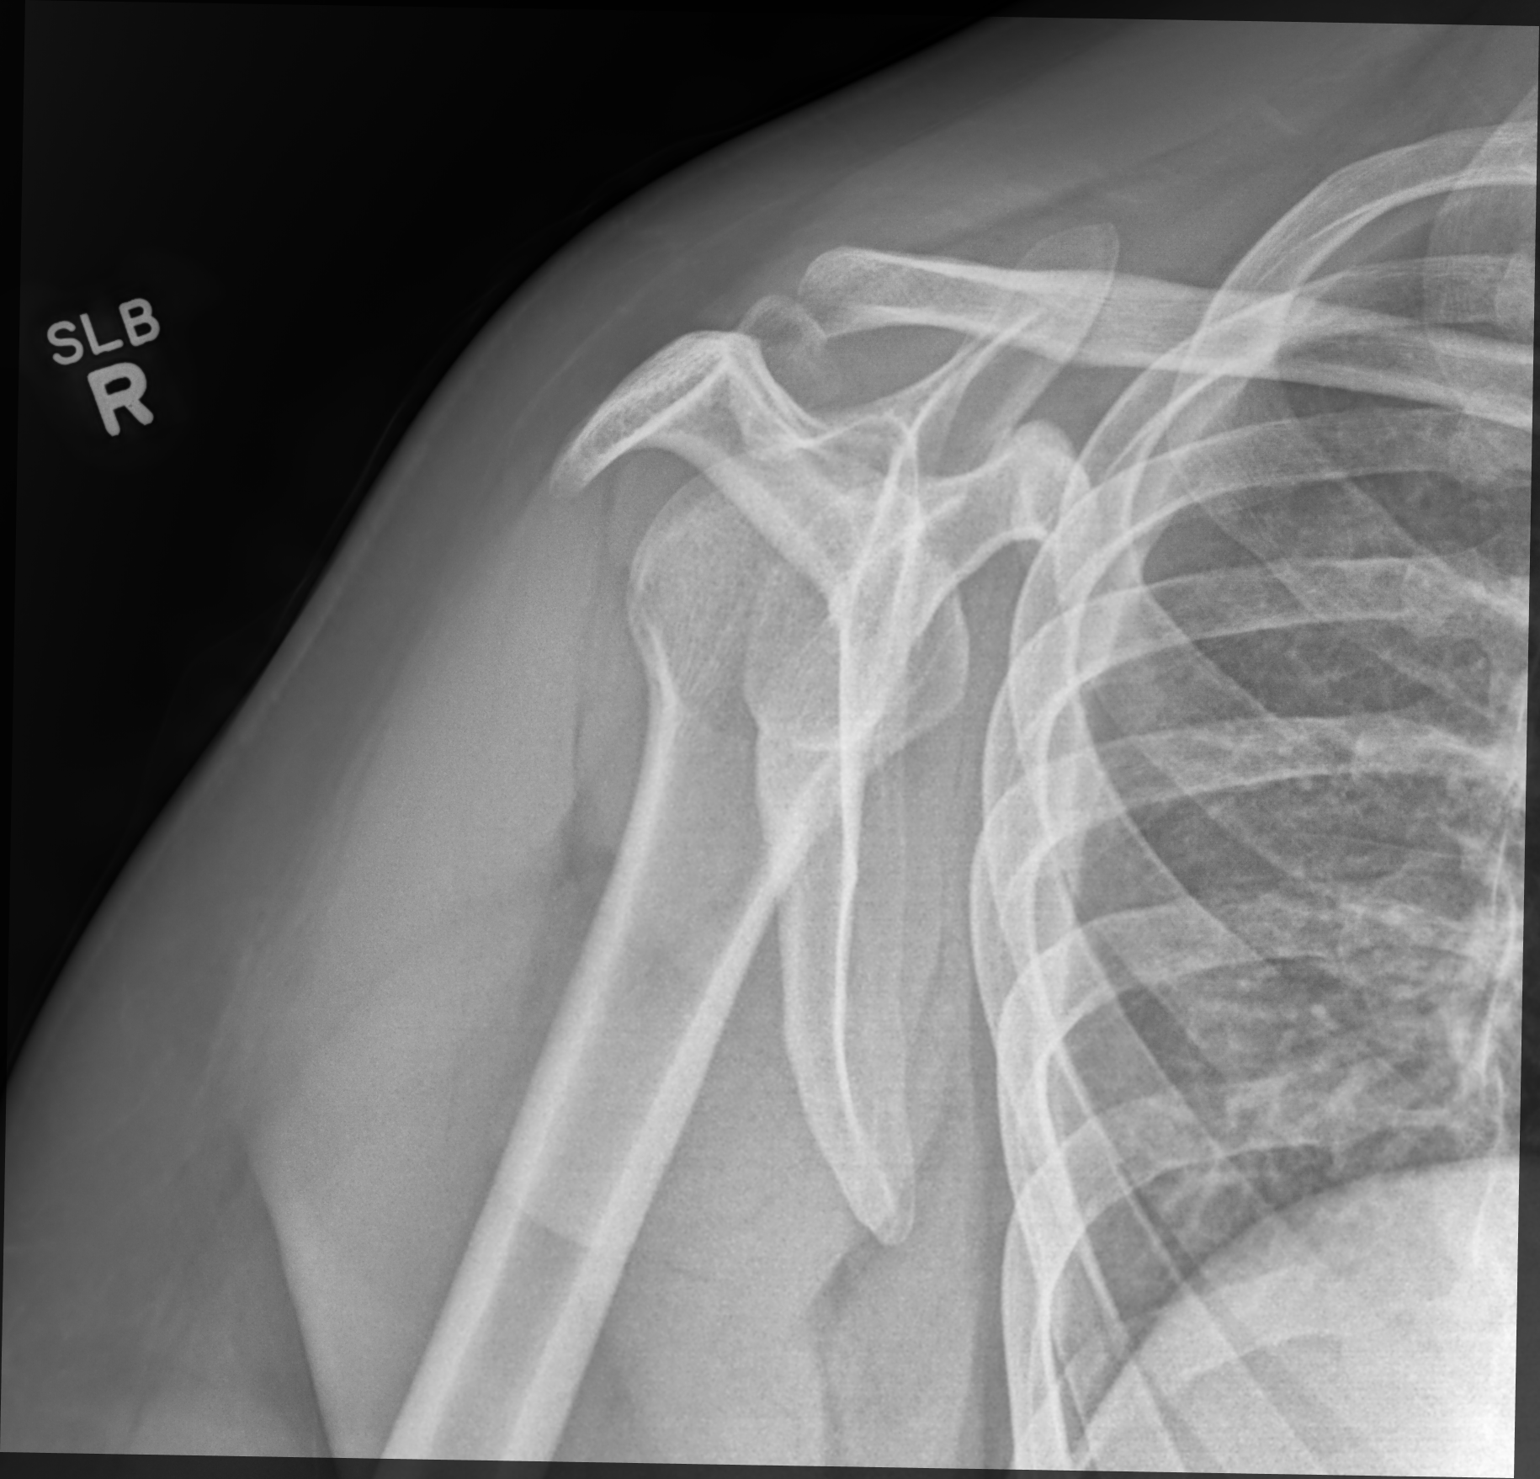

[3 of 3 positions shown; findings below may reference images not displayed]

FINDINGS: The glenohumeral and AC joints are maintained. No acute fractures
identified. No abnormal soft tissue calcifications. Moderate lateral
downsloping of the acromion is noted. The right ribs are intact and
the visualized right lung is clear.
IMPRESSION: No acute bony findings.

## 2023-12-31 ENCOUNTER — Ambulatory Visit: Admitting: Nurse Practitioner

## 2023-12-31 ENCOUNTER — Encounter: Payer: Self-pay | Admitting: Nurse Practitioner

## 2023-12-31 VITALS — BP 110/80 | HR 84 | Temp 98.7°F | Ht 70.0 in | Wt 186.8 lb

## 2023-12-31 DIAGNOSIS — E663 Overweight: Secondary | ICD-10-CM | POA: Diagnosis not present

## 2023-12-31 DIAGNOSIS — J45901 Unspecified asthma with (acute) exacerbation: Secondary | ICD-10-CM

## 2023-12-31 DIAGNOSIS — Z2821 Immunization not carried out because of patient refusal: Secondary | ICD-10-CM | POA: Diagnosis not present

## 2023-12-31 DIAGNOSIS — Z6826 Body mass index (BMI) 26.0-26.9, adult: Secondary | ICD-10-CM | POA: Diagnosis not present

## 2023-12-31 NOTE — Progress Notes (Signed)
 LILLETTE Kristeen JINNY Gladis, CMA,acting as a neurosurgeon for Gaines Ada, FNP.,have documented all relevant documentation on the behalf of Gaines Ada, FNP,as directed by  Gaines Ada, FNP while in the presence of Gaines Ada, FNP.  Subjective:  Patient ID: Javier Mcgee , male    DOB: November 29, 1995 , 28 y.o.   MRN: [id number]  Chief Complaint  Patient presents with   Asthma    Patient presents today for a asthma follow up, Patient reports compliance with medication. Patient denies any chest pain, SOB, or headaches. Patient has no concerns today.      HPI  Discussed the use of AI scribe software for clinical note transcription with the patient, who gave verbal consent to proceed.  History of Present Illness Javier Mcgee is a 28 year old male with asthma who presents for a routine follow-up.  He typically monitors his asthma every six months, especially with changes in weather. Since his last visit, he has only used his inhaler once, coinciding with a change in weather. No significant coughing or wheezing since then. He inquires about the expiration of his inhaler, noting that he has not opened the one he has at home.  He did not receive a flu shot and declined it during this visit.  He had a colonoscopy last year due to his mother's history of colon cancer. He did not attend the follow-up appointment with the GI specialist due to being on vacation.   Past Medical History:  Diagnosis Date   Asthma      Family History  Problem Relation Age of Onset   Colon cancer Mother 71   Healthy Father    Cancer Maternal Grandfather        possibly colon cancer?     Current Outpatient Medications:    Albuterol -Budesonide (AIRSUPRA ) 90-80 MCG/ACT AERO, Inhale 2 puffs into the lungs every 6 (six) hours as needed., Disp: 32.1 g, Rfl: 1   Cholecalciferol (VITAMIN D) 50 MCG (2000 UT) CAPS, Take by mouth., Disp: , Rfl:    Multiple Vitamin (MULTIVITAMIN WITH MINERALS) TABS tablet, Take 1 tablet by mouth  daily., Disp: , Rfl:    Omega 3-6-9 Fatty Acids (OMEGA 3-6-9 COMPLEX PO), Take by mouth., Disp: , Rfl:    No Known Allergies   Review of Systems   Today's Vitals   12/31/23 1431  BP: 110/80  Pulse: 84  Temp: 98.7 F (37.1 C)  TempSrc: Oral  SpO2: 98%  Weight: 186 lb 12.8 oz (84.7 kg)  Height: 5' 10 (1.778 m)  PainSc: 0-No pain   Body mass index is 26.8 kg/m.  Wt Readings from Last 3 Encounters:  12/31/23 186 lb 12.8 oz (84.7 kg)  07/01/23 183 lb 3.2 oz (83.1 kg)  03/03/23 173 lb 9.6 oz (78.7 kg)    The ASCVD Risk score (Arnett DK, et al., 2019) failed to calculate for the following reasons:   The 2019 ASCVD risk score is only valid for ages 66 to 35  Objective:  Physical Exam Vitals and nursing note reviewed.  Constitutional:      General: He is not in acute distress.    Appearance: Normal appearance.  Pulmonary:     Effort: Pulmonary effort is normal. No respiratory distress.     Breath sounds: Normal breath sounds and air entry. No decreased air movement. No decreased breath sounds or wheezing.  Skin:    General: Skin is warm.     Capillary Refill: Capillary refill takes less than 2 seconds.  Neurological:     Mental Status: He is alert.         Assessment And Plan:  Mild asthma with exacerbation, unspecified whether persistent Assessment & Plan: He is well controlled currently, continue current medications   Influenza vaccination declined  Overweight with body mass index (BMI) of 26 to 26.9 in adult    Return for keep same next.  Patient was given opportunity to ask questions. Patient verbalized understanding of the plan and was able to repeat key elements of the plan. All questions were answered to their satisfaction.   LILLETTE Gaines Ada, FNP, have reviewed all documentation for this visit. The documentation on 12/31/23 for the exam, diagnosis, procedures, and orders are all accurate and complete.    IF YOU HAVE BEEN REFERRED TO A SPECIALIST, IT MAY  TAKE 1-2 WEEKS TO SCHEDULE/PROCESS THE REFERRAL. IF YOU HAVE NOT HEARD FROM US /SPECIALIST IN TWO WEEKS, PLEASE GIVE US  A CALL AT [phone].

## 2023-12-31 NOTE — Patient Instructions (Signed)
 You can call [facility] to follow up.

## 2024-01-07 NOTE — Assessment & Plan Note (Signed)
 He is well controlled currently, continue current medications

## 2024-07-01 ENCOUNTER — Encounter: Payer: Self-pay | Admitting: Nurse Practitioner
